# Patient Record
Sex: Female | Born: 1940 | Race: White | Hispanic: No | Marital: Single | Smoking: Former smoker
Health system: Southern US, Community
[De-identification: ages and names within clinical notes are randomized; demographics above are authoritative.]

## PROBLEM LIST (undated history)

## (undated) DIAGNOSIS — Z9889 Other specified postprocedural states: Secondary | ICD-10-CM

## (undated) DIAGNOSIS — R011 Cardiac murmur, unspecified: Secondary | ICD-10-CM

## (undated) DIAGNOSIS — M810 Age-related osteoporosis without current pathological fracture: Secondary | ICD-10-CM

## (undated) DIAGNOSIS — E039 Hypothyroidism, unspecified: Secondary | ICD-10-CM

## (undated) DIAGNOSIS — G629 Polyneuropathy, unspecified: Secondary | ICD-10-CM

## (undated) DIAGNOSIS — M1990 Unspecified osteoarthritis, unspecified site: Secondary | ICD-10-CM

## (undated) DIAGNOSIS — F419 Anxiety disorder, unspecified: Secondary | ICD-10-CM

## (undated) DIAGNOSIS — R112 Nausea with vomiting, unspecified: Secondary | ICD-10-CM

## (undated) DIAGNOSIS — K760 Fatty (change of) liver, not elsewhere classified: Secondary | ICD-10-CM

## (undated) DIAGNOSIS — Z8719 Personal history of other diseases of the digestive system: Secondary | ICD-10-CM

## (undated) DIAGNOSIS — K219 Gastro-esophageal reflux disease without esophagitis: Secondary | ICD-10-CM

## (undated) DIAGNOSIS — N189 Chronic kidney disease, unspecified: Secondary | ICD-10-CM

## (undated) DIAGNOSIS — I10 Essential (primary) hypertension: Secondary | ICD-10-CM

## (undated) HISTORY — PX: MIDDLE EAR SURGERY: SHX713

## (undated) HISTORY — PX: EYE SURGERY: SHX253

## (undated) HISTORY — PX: THYROIDECTOMY: SHX17

## (undated) HISTORY — PX: ABDOMINAL HYSTERECTOMY: SHX81

## (undated) HISTORY — PX: PARATHYROIDECTOMY: SHX19

---

## 2016-05-11 ENCOUNTER — Other Ambulatory Visit: Payer: Self-pay | Admitting: Orthopedic Surgery

## 2016-05-15 ENCOUNTER — Inpatient Hospital Stay (HOSPITAL_COMMUNITY)
Admission: RE | Admit: 2016-05-15 | Discharge: 2016-05-15 | Disposition: A | Discharge status: Home / Self Care | Payer: Self-pay | Source: Ambulatory Visit

## 2016-05-15 ENCOUNTER — Emergency Department (HOSPITAL_COMMUNITY): Payer: Medicare HMO

## 2016-05-15 ENCOUNTER — Encounter (HOSPITAL_COMMUNITY): Payer: Self-pay

## 2016-05-15 ENCOUNTER — Emergency Department (HOSPITAL_COMMUNITY)
Admission: EM | Admit: 2016-05-15 | Discharge: 2016-05-15 | Disposition: A | Discharge status: Home / Self Care | Payer: Medicare HMO | Attending: Emergency Medicine | Admitting: Emergency Medicine

## 2016-05-15 DIAGNOSIS — K529 Noninfective gastroenteritis and colitis, unspecified: Secondary | ICD-10-CM | POA: Insufficient documentation

## 2016-05-15 DIAGNOSIS — R1084 Generalized abdominal pain: Secondary | ICD-10-CM | POA: Diagnosis present

## 2016-05-15 DIAGNOSIS — Z79899 Other long term (current) drug therapy: Secondary | ICD-10-CM | POA: Diagnosis not present

## 2016-05-15 LAB — URINALYSIS, ROUTINE W REFLEX MICROSCOPIC
Bilirubin Urine: NEGATIVE
Glucose, UA: NEGATIVE mg/dL
HGB URINE DIPSTICK: NEGATIVE
Ketones, ur: NEGATIVE mg/dL
LEUKOCYTES UA: NEGATIVE
NITRITE: NEGATIVE
PROTEIN: NEGATIVE mg/dL
Specific Gravity, Urine: 1.013 (ref 1.005–1.030)
pH: 8 (ref 5.0–8.0)

## 2016-05-15 LAB — CBC
HCT: 31.5 % — ABNORMAL LOW (ref 36.0–46.0)
Hemoglobin: 10.4 g/dL — ABNORMAL LOW (ref 12.0–15.0)
MCH: 29.6 pg (ref 26.0–34.0)
MCHC: 33 g/dL (ref 30.0–36.0)
MCV: 89.7 fL (ref 78.0–100.0)
PLATELETS: 414 10*3/uL — AB (ref 150–400)
RBC: 3.51 MIL/uL — ABNORMAL LOW (ref 3.87–5.11)
RDW: 14.2 % (ref 11.5–15.5)
WBC: 8.7 10*3/uL (ref 4.0–10.5)

## 2016-05-15 LAB — COMPREHENSIVE METABOLIC PANEL
ALK PHOS: 110 U/L (ref 38–126)
ALT: 21 U/L (ref 14–54)
AST: 27 U/L (ref 15–41)
Albumin: 3.3 g/dL — ABNORMAL LOW (ref 3.5–5.0)
Anion gap: 10 (ref 5–15)
BILIRUBIN TOTAL: 0.6 mg/dL (ref 0.3–1.2)
BUN: 8 mg/dL (ref 6–20)
CHLORIDE: 99 mmol/L — AB (ref 101–111)
CO2: 27 mmol/L (ref 22–32)
Calcium: 9.6 mg/dL (ref 8.9–10.3)
Creatinine, Ser: 0.69 mg/dL (ref 0.44–1.00)
GFR calc Af Amer: >60 mL/min (ref >60)
Glucose, Bld: 112 mg/dL — ABNORMAL HIGH (ref 65–99)
Potassium: 4.1 mmol/L (ref 3.5–5.1)
Sodium: 136 mmol/L (ref 135–145)
Total Protein: 6.2 g/dL — ABNORMAL LOW (ref 6.5–8.1)

## 2016-05-15 LAB — I-STAT CG4 LACTIC ACID, ED: LACTIC ACID, VENOUS: 1.17 mmol/L (ref 0.5–1.9)

## 2016-05-15 LAB — TROPONIN I: Troponin I: <0.03 ng/mL (ref <0.03)

## 2016-05-15 LAB — LIPASE, BLOOD: LIPASE: 70 U/L — AB (ref 11–51)

## 2016-05-15 MED ORDER — ONDANSETRON HCL 4 MG/2ML IJ SOLN
4.0000 mg | Freq: Once | INTRAMUSCULAR | Status: AC
  Administered 2016-05-15: 4 mg via INTRAVENOUS
  Filled 2016-05-15: qty 2

## 2016-05-15 MED ORDER — FENTANYL CITRATE (PF) 100 MCG/2ML IJ SOLN
50.0000 ug | Freq: Once | INTRAMUSCULAR | Status: AC
  Administered 2016-05-15: 50 ug via INTRAVENOUS
  Filled 2016-05-15: qty 2

## 2016-05-15 MED ORDER — CIPROFLOXACIN HCL 500 MG PO TABS
500.0000 mg | ORAL_TABLET | Freq: Once | ORAL | Status: AC
  Administered 2016-05-15: 500 mg via ORAL
  Filled 2016-05-15: qty 1

## 2016-05-15 MED ORDER — CIPROFLOXACIN HCL 500 MG PO TABS: 500.0000 mg | ORAL_TABLET | Freq: Two times a day (BID) | ORAL | 0 refills | Status: AC

## 2016-05-15 MED ORDER — SODIUM CHLORIDE 0.9 % IV BOLUS (SEPSIS)
1000.0000 mL | Freq: Once | INTRAVENOUS | Status: AC
  Administered 2016-05-15: 1000 mL via INTRAVENOUS

## 2016-05-15 MED ORDER — IOPAMIDOL (ISOVUE-300) INJECTION 61%
INTRAVENOUS | Status: AC
  Administered 2016-05-15: 100 mL
  Filled 2016-05-15: qty 100

## 2016-05-15 MED ORDER — METRONIDAZOLE 500 MG PO TABS
500.0000 mg | ORAL_TABLET | Freq: Once | ORAL | Status: AC
Start: 2016-05-15 — End: 2016-05-15
  Administered 2016-05-15: 500 mg via ORAL
  Filled 2016-05-15: qty 1

## 2016-05-15 MED ORDER — FENTANYL CITRATE (PF) 100 MCG/2ML IJ SOLN
100.0000 ug | Freq: Once | INTRAMUSCULAR | Status: AC
  Administered 2016-05-15: 100 ug via INTRAVENOUS
  Filled 2016-05-15: qty 2

## 2016-05-15 MED ORDER — METRONIDAZOLE 500 MG PO TABS: 500.0000 mg | ORAL_TABLET | Freq: Two times a day (BID) | ORAL | 0 refills | Status: AC

## 2016-05-15 NOTE — Pre-Procedure Instructions (Signed)
Lavonia Danalice Costanza  05/15/2016      [id number] - [place], [place] - [address] AT [facility] [address] [facility] [place] [id number] Phone: [phone] Fax: [phone]    Your procedure is scheduled on 05-17-2016  Thursday .  [facility] at 11:45 A.M.\   Call this number if you have problems the morning of surgery:  [phone]   Remember:  Do not eat food or drink liquids after midnight.   Take these medicines the morning of surgery with A SIP OF WATER Alprazolam(Xanax) if needed,Atenolol(Tenormin),levothyroxine(Synthroid),Pain medication if needed,Kenalog cream if needed   STOP ASPIRIN,ANTIINFLAMATORIES (IBUPROFEN,ALEVE,MOTRIN,ADVIL,GOODY'S POWDERS),HERBAL SUPPLEMENTS,FISH OIL,AND VITAMINS 5-7 DAYS PRIOR TO SURGERY     Do not wear jewelry, make-up or nail polish.  Do not wear lotions, powders, or perfumes, or deoderant.  Do not shave 48 hours prior to surgery.  Men may shave face and neck.  Do not bring valuables to the hospital.  [facility] is not responsible for any belongings or valuables.  Contacts, dentures or bridgework may not be worn into surgery.  Leave your suitcase in the car.  After surgery it may be brought to your room.  For patients admitted to the hospital, discharge time will be determined by your treatment team.  Patients discharged the day of surgery will not be allowed to drive home.   Special Instructions: [place] - Preparing for Surgery  Before surgery, you can play an important role.  Because skin is not sterile, your skin needs to be as free of germs as possible.  You can reduce the number of germs on you skin by washing with CHG (chlorahexidine gluconate) soap before surgery.  CHG is an antiseptic cleaner which kills germs and bonds with the skin to continue killing germs even after washing.  Please DO NOT use if you have an allergy to CHG or antibacterial soaps.  If your  skin becomes reddened/irritated stop using the CHG and inform your nurse when you arrive at Short Stay.  Do not shave (including legs and underarms) for at least 48 hours prior to the first CHG shower.  You may shave your face.  Please follow these instructions carefully:   1.  Shower with CHG Soap the night before surgery and the   morning of Surgery.  2.  If you choose to wash your hair, wash your hair first as usual with your normal shampoo.  3.  After you shampoo, rinse your hair and body thoroughly to remove the  Shampoo.  4.  Use CHG as you would any other liquid soap.  You can apply chg directly  to the skin and wash gently with scrungie or a clean washcloth.  5.  Apply the CHG Soap to your body ONLY FROM THE NECK DOWN.   Do not use on open wounds or open sores.  Avoid contact with your eyes,  ears, mouth and genitals (private parts).  Wash genitals (private parts) with your normal soap.  6.  Wash thoroughly, paying special attention to the area where your surgery will be performed.  7.  Thoroughly rinse your body with warm water from the neck down.  8.  DO NOT shower/wash with your normal soap after using and rinsing o  the CHG Soap.  9.  Pat yourself dry with a clean towel.            10.  Wear clean pajamas.  11.  Place clean sheets on your bed the night of your first shower and do not sleep with pets.  Day of Surgery  Do not apply any lotions/deodorants the morning of surgery.  Please wear clean clothes to the hospital/surgery center.   Please read over the following fact sheets that you were given. Coughing and Deep Breathing and Surgical Site Infection Prevention, Incentive Spirometry

## 2016-05-15 NOTE — ED Notes (Signed)
Patient and family member up to Nurse first desk several times to inquire about wait. Apologized several times and explained process.

## 2016-05-15 NOTE — Progress Notes (Addendum)
Not here for 1200 PAT appointment. Called and left message to call us back. Pt is in the ED at this time.

## 2016-05-15 NOTE — ED Notes (Signed)
Patient transported to CT 

## 2016-05-15 NOTE — ED Notes (Signed)
Called Dr. Ave Filterhandler regarding patient's upcoming surgery

## 2016-05-15 NOTE — ED Triage Notes (Signed)
EMS- pt coming from home with abd pain. She was constipated this week and took milk of magnesia. She has had n/v/d with chills and sweats. Pt has also had non productive cough. She is scheduled to have surgery here in 2 days to repair a shoulder fracture.  No fever. 174/68, 82, 93% on RA.

## 2016-05-15 NOTE — ED Provider Notes (Signed)
18: 25-reevaluation post CT imaging, at the request of Dr. Rhunette CroftNanavati.  Patient has been ill for several days with constipation, for which she took milk of magnesia, now is having nausea vomiting diarrhea chills and sweats.  He is also been ill with a nonproductive cough.  She is due to have surgery, in 2 days for a shoulder fracture.  Ct Abdomen Pelvis W Contrast  Result Date: 05/15/2016 CLINICAL DATA:  Abdominal pain. Nausea, vomiting and diarrhea with chills sweats. Nonproductive cough. EXAM: CT ABDOMEN AND PELVIS WITH CONTRAST TECHNIQUE: Multidetector CT imaging of the abdomen and pelvis was performed using the standard protocol following bolus administration of intravenous contrast. CONTRAST:  100mL ISOVUE-300 IOPAMIDOL (ISOVUE-300) INJECTION 61% COMPARISON:  None. FINDINGS: Lower chest: The visualized cardiac chambers are normal in size. No pericardial effusion. Minimal pleural thickening along the periphery of the left hemithorax with adjacent atelectasis. There is a small hiatal hernia. Hepatobiliary: 10 mm hypodensity in the right hepatic lobe, nonspecific in appearance potentially a cyst or hemangioma. This can be further correlated with a nonemergent ultrasound. No biliary dilatation. Gallbladder is free of stones. Pancreas: Atrophic appearing pancreas without ductal dilatation or mass. Spleen: Normal Adrenals/Urinary Tract: Adrenal glands are unremarkable. Kidneys are normal, without renal calculi, focal lesion, or hydronephrosis. Bladder is unremarkable. Stomach/Bowel: Contracted stomach. Normal small bowel rotation without small-bowel dilatation nor obstruction. Mild t moderate diffuse thickening of the descending and sigmoid colon with scattered diverticulosis. Findings could represent sequela of a mild colitis. Appendix is not optimally visualized. Vascular/Lymphatic: Aortic atherosclerosis without aneurysm. No lymphadenopathy by size criteria. 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 Reproductive: Hysterectomy.  No adnexal mass. Other: No abdominal wall hernia or abnormality. No abdominopelvic ascites. Musculoskeletal: Degenerate disc disease L2-3 and L5-S1. Sclerotic density of the L5 vertebral body is nonspecific likely an isolated large bone island in the absence of medical history that may predispose to osteoblastic disease. IMPRESSION: 1. Mild-to-moderate diffuse transmural thickening of descending and sigmoid colon suspicious for colitis. Superimposed diverticulosis of the sigmoid colon. No bowel obstruction or perforation. 2. 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 3. 10 mm right hepatic hypodensity, nonspecific potentially complex cyst or hemangioma. This can be further correlated with ultrasound on a nonemergent basis. 4. Degenerative disc disease L2-3 and L5-S1 with sclerotic L5 vertebral body densities statistically more likely to represent a bone island. 5. Aortic atherosclerosis. Electronically Signed   By: Tollie Ethavid  Kwon M.D.   On: 05/15/2016 17:54    Medications  fentaNYL (SUBLIMAZE) injection 100 mcg (not administered)  ondansetron (ZOFRAN) injection 4 mg (4 mg Intravenous Given 05/15/16 1613)  sodium chloride 0.9 % bolus 1,000 mL (1,000 mLs Intravenous New Bag/Given 05/15/16 1613)  fentaNYL (SUBLIMAZE) injection 50 mcg (50 mcg Intravenous Given 05/15/16 1613)  iopamidol (ISOVUE-300) 61 % injection (100 mLs  Contrast Given 05/15/16 1727)    Patient Vitals for the past 24 hrs:  BP Temp Temp src Pulse Resp SpO2 Height Weight  05/15/16 1630 (!) 148/79 - - 97 20 100 % - -  05/15/16 1615 130/67 - - 84 - 98 % - -  05/15/16 1600 140/77 - - 80 - 93 % - -  05/15/16 1545 131/62 - - 82 - 96 % - -  05/15/16 1530 (!) 147/83 - - 84 - 100 % - -  05/15/16 1520 128/74 - - 84 - 98 % - -  05/15/16 1410 (!) 115/53 - - 83 (!) 22 95 % - -  05/15/16 1058 (!) 144/64 98.2 F (36.8 C) Oral 90 16 100 % 5' 4.5" (1.638 m) 155  lb (70.3 kg)    6:38 PM Reevaluation with update and discussion. After initial assessment and treatment, an updated evaluation reveals strong the patient is very uncomfortable, in pain, angry about the long duration of her stay, and wants to be put in a new bed.  Her sister and brother-in-law are here, and are directed, and cooperative, with efforts to help the patient.  Patient agrees to get a dose of narcotic analgesia, and be  rearranged in the bed.  Will check temperature.  She has upcoming surgery scheduled for 2 days from now, for repair of right humerus fracture. Sampson Self L   20: 45-at this time she is calm and comfortable and states her nausea has improved.  Currently she is sitting in a chair, and states that she feels much better.  She would like to go home.  Her family members are with her and concur and have no other concerns or questions.  Plan-discharge with prescriptions for Cipro and Flagyl, to treat colitis.  She states that she has plenty of narcotic medication, and antiemetic medication, at home.  She will contact her orthopedist regarding the new diagnosis, in light of the upcoming surgical repair of a fracture.    Mancel Bale, MD 05/15/16 2051

## 2016-05-15 NOTE — Discharge Instructions (Signed)
Plenty of rest, and drink a lot of fluids.  With a simple, bland, diet.  Call your surgeon in the morning to explain that you have been diagnosed with colitis.  Start taking the antibiotic medication, in the morning.

## 2016-05-24 NOTE — Progress Notes (Signed)
Called pt to provide pre-op instructions. Pt spouse stated that pt is currently "  in the hospital for dehydration, colitis, nausea and vomiting, I am not sure when she is coming home."  Spoke with Florentina Addison, Surgical Coordinator, to make MD aware.

## 2016-05-28 ENCOUNTER — Inpatient Hospital Stay: Admit: 2016-05-28 | Payer: Self-pay | Admitting: Orthopedic Surgery

## 2016-05-28 ENCOUNTER — Other Ambulatory Visit: Payer: Self-pay | Admitting: Orthopedic Surgery

## 2016-05-28 SURGERY — ARTHROPLASTY, SHOULDER, TOTAL, REVERSE
Anesthesia: General | Laterality: Right

## 2016-06-03 NOTE — ED Provider Notes (Signed)
[facility] Provider Note   CSN: [id number] Arrival date & time: 05/15/16  1052     History   Chief Complaint Chief Complaint  Patient presents with  . Abdominal Pain    HPI Lynn George is a 76 y.o. female.  HPI  Pt comes in with cc of abd pain. Pt has hx of HL, thyroid disorder. She doesn't have any hx of severe medical hx. Pt reports that for the past few days she has been having constipation - for which she has been taking milk of magnesia. Now she is having abd pain. Her pain is generalized and fairly constant. Pt has associated nausea, emesis. She denies nay back pain or hx of heavy smoking. Patient has no pain with urination, blood in the urine, or frequent urination. Pt also denies any vaginal discharge or bleeding.   History reviewed. No pertinent past medical history.  There are no active problems to display for this patient.   History reviewed. No pertinent surgical history.  OB History    No data available       Home Medications    Prior to Admission medications   Medication Sig Start Date End Date Taking? Authorizing Provider  alendronate (FOSAMAX) 70 MG tablet Take 70 mg by mouth every Monday. 03/20/16  Yes Historical Provider, MD  ALPRAZolam Prudy Feeler) 0.5 MG tablet Take 0.25-0.5 mg by mouth 2 (two) times daily as needed (for anxiety).   Yes Historical Provider, MD  amLODipine (NORVASC) 5 MG tablet Take 5 mg by mouth at bedtime. 05/13/16  Yes Historical Provider, MD  atenolol (TENORMIN) 50 MG tablet Take 50 mg by mouth 2 (two) times daily. 04/27/16  Yes Historical Provider, MD  atorvastatin (LIPITOR) 20 MG tablet Take 20 mg by mouth every evening.   Yes Historical Provider, MD  Calcium 500-125 MG-UNIT TABS Take 1 tablet by mouth daily.   Yes Historical Provider, MD  Cholecalciferol (VITAMIN D3) 5000 units TABS Take 5,000 Units by mouth daily.   Yes Historical Provider, MD  gabapentin (NEURONTIN) 300 MG capsule Take 300-600 mg by mouth at bedtime.    Yes Historical Provider, MD  hydrochlorothiazide (HYDRODIURIL) 25 MG tablet Take 25 mg by mouth daily.   Yes Historical Provider, MD  levothyroxine (SYNTHROID, LEVOTHROID) 88 MCG tablet Take 88 mcg by mouth daily before breakfast.   Yes Historical Provider, MD  nortriptyline (PAMELOR) 25 MG capsule Take 25-50 mg by mouth at bedtime.   Yes Historical Provider, MD  omeprazole (PRILOSEC) 20 MG capsule Take 20 mg by mouth daily.   Yes Historical Provider, MD  ondansetron (ZOFRAN) 4 MG tablet Take 4-8 mg by mouth every 8 (eight) hours as needed for nausea. 05/11/16  Yes Historical Provider, MD  oxyCODONE-acetaminophen (PERCOCET/ROXICET) 5-325 MG tablet Take 1 tablet by mouth every 4 (four) hours as needed for pain. 05/13/16  Yes Historical Provider, MD  sulfamethoxazole-trimethoprim (BACTRIM) 400-80 MG tablet Take 1 tablet by mouth every other day. For UTI prevention.   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   Yes Historical Provider, MD  aspirin EC 81 MG tablet Take 81 mg by mouth at bedtime.    Historical Provider, MD  ciprofloxacin (CIPRO) 500 MG tablet Take 1 tablet (500 mg total) by mouth 2 (two) times daily. One po bid x 7 days 05/15/16   Mancel Bale, MD  metroNIDAZOLE (FLAGYL) 500 MG tablet Take 1 tablet (  500 mg total) by mouth 2 (two) times daily. One po bid x 7 days 05/15/16   Mancel Bale, MD  Omega-3 Fatty Acids (FISH OIL PO) Take 1,000 mg by mouth daily.     Historical Provider, MD    Family History History reviewed. No pertinent family history.  Social History Social History  Substance Use Topics  . Smoking status: Never Smoker  . Smokeless tobacco: Never Used  . Alcohol use No     Allergies   Indocin [indomethacin]; Talwin [pentazocine]; Codeine; Morphine; Lisinopril; and Penicillins   Review of Systems Review of Systems  Constitutional: Positive for activity change.    Gastrointestinal: Positive for abdominal pain, nausea and vomiting.  All other systems reviewed and are negative.    Physical Exam Updated Vital Signs BP (!) 150/68   Pulse 98   Temp 97.6 F (36.4 C) (Oral)   Resp 17   Ht 5' 4.5" (1.638 m)   Wt 155 lb (70.3 kg)   SpO2 100%   BMI 26.19 kg/m   Physical Exam  Constitutional: She is oriented to person, place, and time. She appears well-developed and well-nourished.  HENT:  Head: Normocephalic and atraumatic.  Mucus membrane are moist  Eyes: EOM are normal. Pupils are equal, round, and reactive to light.  Neck: Neck supple.  Cardiovascular: Normal rate, regular rhythm and normal heart sounds.   No murmur heard. Pulmonary/Chest: Effort normal. No respiratory distress.  Abdominal: Soft. She exhibits no distension. There is tenderness. There is no rebound and no guarding.  Generalized tenderness, no peritoneal signs  Neurological: She is alert and oriented to person, place, and time.  Skin: Skin is warm and dry.  Nursing note and vitals reviewed.    ED Treatments / Results  Labs (all labs ordered are listed, but only abnormal results are displayed) Labs Reviewed  LIPASE, BLOOD - Abnormal; Notable for the following:       Result Value   Lipase 70 (*)    All other components within normal limits  COMPREHENSIVE METABOLIC PANEL - Abnormal; Notable for the following:    Chloride 99 (*)    Glucose, Bld 112 (*)    Total Protein 6.2 (*)    Albumin 3.3 (*)    All other components within normal limits  CBC - Abnormal; Notable for the following:    RBC 3.51 (*)    Hemoglobin 10.4 (*)    HCT 31.5 (*)    Platelets 414 (*)    All other components within normal limits  URINALYSIS, ROUTINE W REFLEX MICROSCOPIC - Abnormal; Notable for the following:    APPearance HAZY (*)    All other components within normal limits  TROPONIN I  I-STAT CG4 LACTIC ACID, ED    EKG  EKG Interpretation  Date/Time:  Tuesday May 15 2016  16:26:56 EDT Ventricular Rate:  90 PR Interval:    QRS Duration: 90 QT Interval:  413 QTC Calculation: 506 R Axis:   32 Text Interpretation:  Sinus rhythm Probable anterolateral infarct, old Prolonged QT interval diffuse q waves No acute changes Confirmed by Rhunette Croft, MD, Janey Genta [phone]) on 05/15/2016 4:54:18 PM       Radiology No results found.  Procedures Procedures (including critical care time)  Medications Ordered in ED Medications  ondansetron (ZOFRAN) injection 4 mg (4 mg Intravenous Given 05/15/16 1613)  sodium chloride 0.9 % bolus 1,000 mL (0 mLs Intravenous Stopped 05/15/16 1904)  fentaNYL (SUBLIMAZE) injection 50 mcg (50 mcg Intravenous Given 05/15/16 1613)  iopamidol (ISOVUE-300) 61 % injection (100 mLs  Contrast Given 05/15/16 1727)  fentaNYL (SUBLIMAZE) injection 100 mcg (100 mcg Intravenous Given 05/15/16 1900)  ondansetron (ZOFRAN) injection 4 mg (4 mg Intravenous Given 05/15/16 1929)  metroNIDAZOLE (FLAGYL) tablet 500 mg (500 mg Oral Given 05/15/16 1929)  ciprofloxacin (CIPRO) tablet 500 mg (500 mg Oral Given 05/15/16 1929)     Initial Impression / Assessment and Plan / ED Course  I have reviewed the triage vital signs and the nursing notes.  Pertinent labs & imaging results that were available during my care of the patient were reviewed by me and considered in my medical decision making (see chart for details).     Pt comes in with cc of abd pain.  Pt's abd pain is not focal and there is no peritoneal signs. Pt had a recent upper extremity fx and is on pain meds. It seems like she is having constipation, which could be opioid related. Pt is having worsening of the pain, and also has had some loose BM. DDX: colitis, mesenteric ischemia, diverticulitis, ileus, sbo, cystitis, abscess.  Plan is to get CT abd. Pt's care will be signed out to the incoming team.  Final Clinical Impressions(s) / ED Diagnoses   Final diagnoses:  Colitis    New Prescriptions Discharge  Medication List as of 05/15/2016  8:48 PM    START taking these medications   Details  ciprofloxacin (CIPRO) 500 MG tablet Take 1 tablet (500 mg total) by mouth 2 (two) times daily. One po bid x 7 days, Starting Tue 05/15/2016, Print    metroNIDAZOLE (FLAGYL) 500 MG tablet Take 1 tablet (500 mg total) by mouth 2 (two) times daily. One po bid x 7 days, Starting Tue 05/15/2016, Print         Derwood Kaplan, MD 06/03/16 [phone]

## 2016-06-06 ENCOUNTER — Encounter (HOSPITAL_COMMUNITY): Payer: Self-pay | Admitting: *Deleted

## 2016-06-06 NOTE — Progress Notes (Addendum)
Anesthesia Chart Review: SAME DAY WORK-UP.  Patient is a 76 year old female scheduled for right reverse shoulder arthroplasty on 06/07/16 by Dr. Ave Filter. She slipped and fell on black ice on 05/01/16 and sustained a proximal right humerus fracture. Surgery for repair was initially scheduled for 05/28/16, but was cancelled due to admission at [facility] (05/22/16-05/26/16) for sigmoid colitis and dehydration. She was given IV Cipro and Flagyl and symptoms gradually improved. She was discharged home on oral Cipro and Augmentin with plans to undergo shoulder surgery the following week. She was also re-evaluated on 06/05/16 by Douglass Rivers at [facility] for hospital follow-up, and she is also aware of surgery plans.  History includes former smoker, chronic kidney disease, hypertension, parathyroidectomy, thyroidectomy, hypothyroidism, anxiety, hiatal hernia, neuropathy, fatty liver, arthritis, GERD, osteoporosis, murmur (reported evaluation that was "okay" ~ 10 years ago), hysterectomy.   EKG 05/15/16: Sinus rhythm, probable anterior lateral infarct (age undetermined), prolonged QT interval (QT 413 ms, QTc 506 ms), diffuse (small) Q waves. (There is a prior tracing result narrative from 05/07/16 at [facility] that was done as part of a pre-operative evaluation by Juanetta Snow, NP that reads as NSR, possible lateral and inferior infarcts, age undetermined. Systolic murmur documented at that time with patient reporting it had previously been evaluated > 5 year ago. There was no symptoms of CHF, no edema, or need for supplement oxygen, so it appears nothing further was recommended then.) There are no prior tracings in Muse or Epic.    CXR 05/26/16: IMPRESSION: No plain radiographic evidence of acute cardiopulmonary disease.  US Liver 05/23/16 [facility]; Care Everywhere): Impression: Hepatic steatosis.  CT right shoulder 05/02/16 [facility]; Care Everywhere): IMPRESSION:  Comminuted  minimally displaced fracture of the proximal right humerus Fatty atrophy of the infraspinatus muscle suspicious for prior rotator cuff tear.  She needs labs on arrival.   She is a same day work-up, so further evaluation by her anesthesiologist on the day of surgery.   Velna Ochs [facility] Short Stay Center/Anesthesiology Phone [phone] 06/06/2016 3:07 PM

## 2016-06-06 NOTE — Progress Notes (Signed)
Patient verbalized understanding of instructions for surgery.  Patient stated that she had no further instructions  Instructed patient to Take all other medications as prescribed except 7 days prior to surgery STOP taking any Aspirin, Aleve, Naproxen, Ibuprofen, Motrin, Advil, Goody's, BC's, all herbal medications, fish oil, and all vitamins  Patient stated that she had an ECHO but its been about 10 years ago and patient could not remember when or where it exactly was at

## 2016-06-07 ENCOUNTER — Inpatient Hospital Stay (HOSPITAL_COMMUNITY): Payer: Worker's Compensation

## 2016-06-07 ENCOUNTER — Inpatient Hospital Stay (HOSPITAL_COMMUNITY): Payer: Worker's Compensation | Admitting: Vascular Surgery

## 2016-06-07 ENCOUNTER — Encounter (HOSPITAL_COMMUNITY): Payer: Self-pay | Admitting: *Deleted

## 2016-06-07 ENCOUNTER — Inpatient Hospital Stay (HOSPITAL_COMMUNITY)
Admission: RE | Admit: 2016-06-07 | Discharge: 2016-06-08 | DRG: 483 | Disposition: A | Discharge status: Home Health | Payer: Worker's Compensation | Source: Ambulatory Visit | Attending: Orthopedic Surgery | Admitting: Orthopedic Surgery

## 2016-06-07 ENCOUNTER — Encounter (HOSPITAL_COMMUNITY)
Admission: RE | Disposition: A | Discharge status: Home Health | Payer: Self-pay | Source: Ambulatory Visit | Attending: Orthopedic Surgery

## 2016-06-07 DIAGNOSIS — Z79899 Other long term (current) drug therapy: Secondary | ICD-10-CM

## 2016-06-07 DIAGNOSIS — G629 Polyneuropathy, unspecified: Secondary | ICD-10-CM | POA: Diagnosis present

## 2016-06-07 DIAGNOSIS — M25511 Pain in right shoulder: Secondary | ICD-10-CM | POA: Diagnosis present

## 2016-06-07 DIAGNOSIS — F419 Anxiety disorder, unspecified: Secondary | ICD-10-CM | POA: Diagnosis present

## 2016-06-07 DIAGNOSIS — K760 Fatty (change of) liver, not elsewhere classified: Secondary | ICD-10-CM | POA: Diagnosis present

## 2016-06-07 DIAGNOSIS — Z880 Allergy status to penicillin: Secondary | ICD-10-CM

## 2016-06-07 DIAGNOSIS — H269 Unspecified cataract: Secondary | ICD-10-CM | POA: Diagnosis present

## 2016-06-07 DIAGNOSIS — E876 Hypokalemia: Secondary | ICD-10-CM | POA: Diagnosis present

## 2016-06-07 DIAGNOSIS — I129 Hypertensive chronic kidney disease with stage 1 through stage 4 chronic kidney disease, or unspecified chronic kidney disease: Secondary | ICD-10-CM | POA: Diagnosis present

## 2016-06-07 DIAGNOSIS — E039 Hypothyroidism, unspecified: Secondary | ICD-10-CM | POA: Diagnosis present

## 2016-06-07 DIAGNOSIS — N189 Chronic kidney disease, unspecified: Secondary | ICD-10-CM | POA: Diagnosis present

## 2016-06-07 DIAGNOSIS — Z87891 Personal history of nicotine dependence: Secondary | ICD-10-CM

## 2016-06-07 DIAGNOSIS — Z96612 Presence of left artificial shoulder joint: Secondary | ICD-10-CM

## 2016-06-07 DIAGNOSIS — S42241A 4-part fracture of surgical neck of right humerus, initial encounter for closed fracture: Principal | ICD-10-CM | POA: Diagnosis present

## 2016-06-07 DIAGNOSIS — M810 Age-related osteoporosis without current pathological fracture: Secondary | ICD-10-CM | POA: Diagnosis present

## 2016-06-07 DIAGNOSIS — K219 Gastro-esophageal reflux disease without esophagitis: Secondary | ICD-10-CM | POA: Diagnosis present

## 2016-06-07 DIAGNOSIS — W000XXA Fall on same level due to ice and snow, initial encounter: Secondary | ICD-10-CM | POA: Diagnosis present

## 2016-06-07 DIAGNOSIS — Z888 Allergy status to other drugs, medicaments and biological substances status: Secondary | ICD-10-CM

## 2016-06-07 DIAGNOSIS — Z01818 Encounter for other preprocedural examination: Secondary | ICD-10-CM

## 2016-06-07 DIAGNOSIS — Z96611 Presence of right artificial shoulder joint: Secondary | ICD-10-CM

## 2016-06-07 DIAGNOSIS — Z7982 Long term (current) use of aspirin: Secondary | ICD-10-CM

## 2016-06-07 HISTORY — PX: REVERSE SHOULDER ARTHROPLASTY: SHX5054

## 2016-06-07 HISTORY — DX: Cardiac murmur, unspecified: R01.1

## 2016-06-07 HISTORY — DX: Hypothyroidism, unspecified: E03.9

## 2016-06-07 HISTORY — DX: Other specified postprocedural states: Z98.890

## 2016-06-07 HISTORY — DX: Anxiety disorder, unspecified: F41.9

## 2016-06-07 HISTORY — DX: Gastro-esophageal reflux disease without esophagitis: K21.9

## 2016-06-07 HISTORY — DX: Age-related osteoporosis without current pathological fracture: M81.0

## 2016-06-07 HISTORY — DX: Fatty (change of) liver, not elsewhere classified: K76.0

## 2016-06-07 HISTORY — DX: Essential (primary) hypertension: I10

## 2016-06-07 HISTORY — DX: Other specified postprocedural states: R11.2

## 2016-06-07 HISTORY — DX: Personal history of other diseases of the digestive system: Z87.19

## 2016-06-07 HISTORY — DX: Polyneuropathy, unspecified: G62.9

## 2016-06-07 HISTORY — DX: Chronic kidney disease, unspecified: N18.9

## 2016-06-07 HISTORY — DX: Unspecified osteoarthritis, unspecified site: M19.90

## 2016-06-07 LAB — CBC WITH DIFFERENTIAL/PLATELET
BASOS ABS: 0 10*3/uL (ref 0.0–0.1)
BASOS PCT: 0 %
Eosinophils Absolute: 0.2 10*3/uL (ref 0.0–0.7)
Eosinophils Relative: 3 %
HEMATOCRIT: 34.6 % — AB (ref 36.0–46.0)
Hemoglobin: 11.4 g/dL — ABNORMAL LOW (ref 12.0–15.0)
Lymphocytes Relative: 34 %
Lymphs Abs: 2.1 10*3/uL (ref 0.7–4.0)
MCH: 29.2 pg (ref 26.0–34.0)
MCHC: 32.9 g/dL (ref 30.0–36.0)
MCV: 88.7 fL (ref 78.0–100.0)
MONO ABS: 0.7 10*3/uL (ref 0.1–1.0)
MONOS PCT: 11 %
NEUTROS ABS: 3.3 10*3/uL (ref 1.7–7.7)
Neutrophils Relative %: 52 %
PLATELETS: 216 10*3/uL (ref 150–400)
RBC: 3.9 MIL/uL (ref 3.87–5.11)
RDW: 14.3 % (ref 11.5–15.5)
WBC: 6.3 10*3/uL (ref 4.0–10.5)

## 2016-06-07 LAB — COMPREHENSIVE METABOLIC PANEL
ALBUMIN: 3.7 g/dL (ref 3.5–5.0)
ALT: 17 U/L (ref 14–54)
ANION GAP: 9 (ref 5–15)
AST: 37 U/L (ref 15–41)
Alkaline Phosphatase: 84 U/L (ref 38–126)
BILIRUBIN TOTAL: 1.6 mg/dL — AB (ref 0.3–1.2)
BUN: 13 mg/dL (ref 6–20)
CO2: 30 mmol/L (ref 22–32)
Calcium: 9.4 mg/dL (ref 8.9–10.3)
Chloride: 96 mmol/L — ABNORMAL LOW (ref 101–111)
Creatinine, Ser: 0.83 mg/dL (ref 0.44–1.00)
GFR calc Af Amer: >60 mL/min (ref >60)
GFR calc non Af Amer: >60 mL/min (ref >60)
GLUCOSE: 82 mg/dL (ref 65–99)
POTASSIUM: 3.7 mmol/L (ref 3.5–5.1)
SODIUM: 135 mmol/L (ref 135–145)
TOTAL PROTEIN: 6.6 g/dL (ref 6.5–8.1)

## 2016-06-07 LAB — PROTIME-INR
INR: 1
Prothrombin Time: 13.2 seconds (ref 11.4–15.2)

## 2016-06-07 LAB — SURGICAL PCR SCREEN
MRSA, PCR: NEGATIVE
Staphylococcus aureus: NEGATIVE

## 2016-06-07 LAB — APTT: APTT: 23 s — AB (ref 24–36)

## 2016-06-07 SURGERY — ARTHROPLASTY, SHOULDER, TOTAL, REVERSE
Anesthesia: General | Site: Shoulder | Laterality: Right

## 2016-06-07 MED ORDER — EPHEDRINE SULFATE-NACL 50-0.9 MG/10ML-% IV SOSY
PREFILLED_SYRINGE | INTRAVENOUS | Status: DC | PRN
  Administered 2016-06-07 (×2): 10 mg via INTRAVENOUS
  Administered 2016-06-07: 25 mg via INTRAVENOUS

## 2016-06-07 MED ORDER — SODIUM CHLORIDE 0.9 % IV SOLN
INTRAVENOUS | Status: DC | PRN
  Administered 2016-06-07: 50 ug/min via INTRAVENOUS

## 2016-06-07 MED ORDER — LEVOTHYROXINE SODIUM 88 MCG PO TABS
88.0000 ug | ORAL_TABLET | Freq: Every day | ORAL | Status: DC
  Administered 2016-06-08: 88 ug via ORAL
  Filled 2016-06-07: qty 1

## 2016-06-07 MED ORDER — DIPHENHYDRAMINE HCL 50 MG/ML IJ SOLN
INTRAMUSCULAR | Status: DC | PRN
  Administered 2016-06-07: 10 mg via INTRAVENOUS

## 2016-06-07 MED ORDER — LACTATED RINGERS IV SOLN
INTRAVENOUS | Status: DC
  Administered 2016-06-07 (×2): via INTRAVENOUS

## 2016-06-07 MED ORDER — SUGAMMADEX SODIUM 200 MG/2ML IV SOLN
INTRAVENOUS | Status: DC | PRN
  Administered 2016-06-07: 200 mg via INTRAVENOUS

## 2016-06-07 MED ORDER — MEPERIDINE HCL 25 MG/ML IJ SOLN: 6.2500 mg | INTRAMUSCULAR | Status: DC | PRN

## 2016-06-07 MED ORDER — METOCLOPRAMIDE HCL 5 MG/ML IJ SOLN: 5.0000 mg | Freq: Three times a day (TID) | INTRAMUSCULAR | Status: DC | PRN

## 2016-06-07 MED ORDER — MORPHINE SULFATE (PF) 2 MG/ML IV SOLN: 1.0000 mg | INTRAVENOUS | Status: DC | PRN

## 2016-06-07 MED ORDER — ROCURONIUM BROMIDE 10 MG/ML (PF) SYRINGE
PREFILLED_SYRINGE | INTRAVENOUS | Status: DC | PRN
  Administered 2016-06-07: 20 mg via INTRAVENOUS
  Administered 2016-06-07: 50 mg via INTRAVENOUS

## 2016-06-07 MED ORDER — MIDAZOLAM HCL 2 MG/2ML IJ SOLN
INTRAMUSCULAR | Status: AC
  Filled 2016-06-07: qty 2

## 2016-06-07 MED ORDER — SODIUM CHLORIDE 0.9 % IR SOLN
Status: DC | PRN
Start: 2016-06-07 — End: 2016-06-07
  Administered 2016-06-07: 3000 mL

## 2016-06-07 MED ORDER — FENTANYL CITRATE (PF) 250 MCG/5ML IJ SOLN
INTRAMUSCULAR | Status: DC | PRN
  Administered 2016-06-07: 100 ug via INTRAVENOUS

## 2016-06-07 MED ORDER — MIDAZOLAM HCL 2 MG/2ML IJ SOLN
1.0000 mg | Freq: Once | INTRAMUSCULAR | Status: AC
  Administered 2016-06-07: 1 mg via INTRAVENOUS
  Filled 2016-06-07: qty 1

## 2016-06-07 MED ORDER — OXYCODONE-ACETAMINOPHEN 5-325 MG PO TABS: 1.0000 | ORAL_TABLET | ORAL | 0 refills | Status: AC | PRN

## 2016-06-07 MED ORDER — CLINDAMYCIN PHOSPHATE 600 MG/50ML IV SOLN
600.0000 mg | Freq: Four times a day (QID) | INTRAVENOUS | Status: AC
  Administered 2016-06-07 – 2016-06-08 (×3): 600 mg via INTRAVENOUS
  Filled 2016-06-07 (×3): qty 50

## 2016-06-07 MED ORDER — SODIUM CHLORIDE 0.9 % IJ SOLN
INTRAMUSCULAR | Status: DC | PRN
  Administered 2016-06-07: 40 mL

## 2016-06-07 MED ORDER — HYDROCHLOROTHIAZIDE 25 MG PO TABS
25.0000 mg | ORAL_TABLET | Freq: Every day | ORAL | Status: DC
  Filled 2016-06-07: qty 1

## 2016-06-07 MED ORDER — BUPIVACAINE LIPOSOME 1.3 % IJ SUSP
20.0000 mL | INTRAMUSCULAR | Status: DC
  Filled 2016-06-07: qty 20

## 2016-06-07 MED ORDER — SODIUM CHLORIDE 0.9 % IV SOLN
INTRAVENOUS | Status: DC
  Administered 2016-06-07: 18:00:00 via INTRAVENOUS

## 2016-06-07 MED ORDER — PHENOL 1.4 % MT LIQD
1.0000 | OROMUCOSAL | Status: DC | PRN
Start: 2016-06-07 — End: 2016-06-08

## 2016-06-07 MED ORDER — SCOPOLAMINE 1 MG/3DAYS TD PT72
MEDICATED_PATCH | TRANSDERMAL | Status: DC | PRN
  Administered 2016-06-07: 1 via TRANSDERMAL

## 2016-06-07 MED ORDER — ALUM & MAG HYDROXIDE-SIMETH 200-200-20 MG/5ML PO SUSP: 30.0000 mL | ORAL | Status: DC | PRN

## 2016-06-07 MED ORDER — VANCOMYCIN HCL IN DEXTROSE 1-5 GM/200ML-% IV SOLN
1000.0000 mg | INTRAVENOUS | Status: AC
  Administered 2016-06-07: 1000 mg via INTRAVENOUS
  Filled 2016-06-07: qty 200

## 2016-06-07 MED ORDER — ALPRAZOLAM 0.25 MG PO TABS
0.2500 mg | ORAL_TABLET | Freq: Two times a day (BID) | ORAL | Status: DC | PRN
  Administered 2016-06-07: 0.5 mg via ORAL
  Filled 2016-06-07: qty 2

## 2016-06-07 MED ORDER — DEXAMETHASONE SODIUM PHOSPHATE 10 MG/ML IJ SOLN
INTRAMUSCULAR | Status: DC | PRN
  Administered 2016-06-07: 10 mg via INTRAVENOUS

## 2016-06-07 MED ORDER — SCOPOLAMINE 1 MG/3DAYS TD PT72
MEDICATED_PATCH | TRANSDERMAL | Status: AC
  Filled 2016-06-07: qty 1

## 2016-06-07 MED ORDER — DOCUSATE SODIUM 100 MG PO CAPS
100.0000 mg | ORAL_CAPSULE | Freq: Two times a day (BID) | ORAL | Status: DC
  Administered 2016-06-07 – 2016-06-08 (×2): 100 mg via ORAL
  Filled 2016-06-07 (×2): qty 1

## 2016-06-07 MED ORDER — PROPOFOL 10 MG/ML IV BOLUS
INTRAVENOUS | Status: DC | PRN
  Administered 2016-06-07: 100 mg via INTRAVENOUS

## 2016-06-07 MED ORDER — 0.9 % SODIUM CHLORIDE (POUR BTL) OPTIME
TOPICAL | Status: DC | PRN
  Administered 2016-06-07: 1000 mL

## 2016-06-07 MED ORDER — LIDOCAINE 2% (20 MG/ML) 5 ML SYRINGE
INTRAMUSCULAR | Status: DC | PRN
  Administered 2016-06-07: 40 mg via INTRAVENOUS

## 2016-06-07 MED ORDER — METOCLOPRAMIDE HCL 5 MG PO TABS: 5.0000 mg | ORAL_TABLET | Freq: Three times a day (TID) | ORAL | Status: DC | PRN

## 2016-06-07 MED ORDER — ATENOLOL 50 MG PO TABS
50.0000 mg | ORAL_TABLET | Freq: Two times a day (BID) | ORAL | Status: DC
  Administered 2016-06-07 – 2016-06-08 (×2): 50 mg via ORAL
  Filled 2016-06-07 (×2): qty 1

## 2016-06-07 MED ORDER — METOCLOPRAMIDE HCL 5 MG/ML IJ SOLN: 10.0000 mg | Freq: Once | INTRAMUSCULAR | Status: DC | PRN

## 2016-06-07 MED ORDER — OXYCODONE HCL 5 MG PO TABS
5.0000 mg | ORAL_TABLET | ORAL | Status: DC | PRN
  Administered 2016-06-07 – 2016-06-08 (×3): 5 mg via ORAL
  Administered 2016-06-08: 10 mg via ORAL
  Administered 2016-06-08: 5 mg via ORAL
  Filled 2016-06-07 (×3): qty 1
  Filled 2016-06-07: qty 2
  Filled 2016-06-07: qty 1

## 2016-06-07 MED ORDER — TRANEXAMIC ACID 1000 MG/10ML IV SOLN
1000.0000 mg | INTRAVENOUS | Status: AC
  Administered 2016-06-07: 1000 mg via INTRAVENOUS
  Filled 2016-06-07: qty 10

## 2016-06-07 MED ORDER — MIDAZOLAM HCL 2 MG/2ML IJ SOLN
INTRAMUSCULAR | Status: AC
  Administered 2016-06-07: 1 mg via INTRAVENOUS
  Filled 2016-06-07: qty 2

## 2016-06-07 MED ORDER — FENTANYL CITRATE (PF) 100 MCG/2ML IJ SOLN: 25.0000 ug | INTRAMUSCULAR | Status: DC | PRN

## 2016-06-07 MED ORDER — AMLODIPINE BESYLATE 5 MG PO TABS
5.0000 mg | ORAL_TABLET | Freq: Every day | ORAL | Status: DC
  Administered 2016-06-07: 5 mg via ORAL
  Filled 2016-06-07: qty 1

## 2016-06-07 MED ORDER — DIPHENHYDRAMINE HCL 12.5 MG/5ML PO ELIX: 12.5000 mg | ORAL_SOLUTION | ORAL | Status: DC | PRN

## 2016-06-07 MED ORDER — FENTANYL CITRATE (PF) 100 MCG/2ML IJ SOLN
50.0000 ug | Freq: Once | INTRAMUSCULAR | Status: AC
  Administered 2016-06-07: 50 ug via INTRAVENOUS
  Filled 2016-06-07: qty 1

## 2016-06-07 MED ORDER — PROPOFOL 10 MG/ML IV BOLUS
INTRAVENOUS | Status: AC
  Filled 2016-06-07: qty 20

## 2016-06-07 MED ORDER — FENTANYL CITRATE (PF) 250 MCG/5ML IJ SOLN
INTRAMUSCULAR | Status: AC
  Filled 2016-06-07: qty 5

## 2016-06-07 MED ORDER — MENTHOL 3 MG MT LOZG: 1.0000 | LOZENGE | OROMUCOSAL | Status: DC | PRN

## 2016-06-07 MED ORDER — BUPIVACAINE-EPINEPHRINE (PF) 0.5% -1:200000 IJ SOLN
INTRAMUSCULAR | Status: DC | PRN
  Administered 2016-06-07: 30 mL via PERINEURAL

## 2016-06-07 MED ORDER — MIDAZOLAM HCL 2 MG/2ML IJ SOLN
INTRAMUSCULAR | Status: DC | PRN
  Administered 2016-06-07: 2 mg via INTRAVENOUS

## 2016-06-07 MED ORDER — FENTANYL CITRATE (PF) 100 MCG/2ML IJ SOLN
INTRAMUSCULAR | Status: AC
  Administered 2016-06-07: 50 ug via INTRAVENOUS
  Filled 2016-06-07: qty 2

## 2016-06-07 MED ORDER — BUPIVACAINE LIPOSOME 1.3 % IJ SUSP
INTRAMUSCULAR | Status: DC | PRN
  Administered 2016-06-07: 20 mL

## 2016-06-07 MED ORDER — ALENDRONATE SODIUM 70 MG PO TABS: 70.0000 mg | ORAL_TABLET | ORAL | Status: DC

## 2016-06-07 MED ORDER — ASPIRIN EC 325 MG PO TBEC
325.0000 mg | DELAYED_RELEASE_TABLET | Freq: Every day | ORAL | Status: DC
  Administered 2016-06-08: 325 mg via ORAL
  Filled 2016-06-07: qty 1

## 2016-06-07 MED ORDER — PANTOPRAZOLE SODIUM 40 MG PO TBEC
40.0000 mg | DELAYED_RELEASE_TABLET | Freq: Every day | ORAL | Status: DC
  Administered 2016-06-08: 40 mg via ORAL
  Filled 2016-06-07: qty 1

## 2016-06-07 MED ORDER — ZOLPIDEM TARTRATE 5 MG PO TABS: 5.0000 mg | ORAL_TABLET | Freq: Every evening | ORAL | Status: DC | PRN

## 2016-06-07 MED ORDER — FLEET ENEMA 7-19 GM/118ML RE ENEM: 1.0000 | ENEMA | Freq: Once | RECTAL | Status: DC | PRN

## 2016-06-07 MED ORDER — ATORVASTATIN CALCIUM 20 MG PO TABS
20.0000 mg | ORAL_TABLET | Freq: Every evening | ORAL | Status: DC
Start: 2016-06-07 — End: 2016-06-08
  Administered 2016-06-07: 20 mg via ORAL
  Filled 2016-06-07: qty 1

## 2016-06-07 MED ORDER — DOCUSATE SODIUM 100 MG PO CAPS: 100.0000 mg | ORAL_CAPSULE | Freq: Three times a day (TID) | ORAL | 0 refills | Status: AC | PRN

## 2016-06-07 MED ORDER — ONDANSETRON HCL 4 MG/2ML IJ SOLN
INTRAMUSCULAR | Status: DC | PRN
  Administered 2016-06-07 (×2): 4 mg via INTRAVENOUS

## 2016-06-07 MED ORDER — ONDANSETRON HCL 4 MG/2ML IJ SOLN
4.0000 mg | Freq: Four times a day (QID) | INTRAMUSCULAR | Status: DC | PRN
  Administered 2016-06-07: 4 mg via INTRAVENOUS
  Filled 2016-06-07: qty 2

## 2016-06-07 MED ORDER — ACETAMINOPHEN 500 MG PO TABS
1000.0000 mg | ORAL_TABLET | Freq: Four times a day (QID) | ORAL | Status: DC
  Administered 2016-06-07 – 2016-06-08 (×3): 1000 mg via ORAL
  Filled 2016-06-07 (×3): qty 2

## 2016-06-07 MED ORDER — POLYETHYLENE GLYCOL 3350 17 G PO PACK: 17.0000 g | PACK | Freq: Every day | ORAL | Status: DC | PRN

## 2016-06-07 MED ORDER — BISACODYL 5 MG PO TBEC: 5.0000 mg | DELAYED_RELEASE_TABLET | Freq: Every day | ORAL | Status: DC | PRN

## 2016-06-07 MED ORDER — ONDANSETRON HCL 4 MG PO TABS
4.0000 mg | ORAL_TABLET | Freq: Four times a day (QID) | ORAL | Status: DC | PRN
  Administered 2016-06-08: 4 mg via ORAL
  Filled 2016-06-07 (×2): qty 1

## 2016-06-07 MED ORDER — NORTRIPTYLINE HCL 25 MG PO CAPS
50.0000 mg | ORAL_CAPSULE | Freq: Every day | ORAL | Status: DC
  Filled 2016-06-07: qty 2

## 2016-06-07 MED ORDER — GABAPENTIN 300 MG PO CAPS
300.0000 mg | ORAL_CAPSULE | Freq: Every day | ORAL | Status: DC
  Filled 2016-06-07: qty 1

## 2016-06-07 MED ORDER — POVIDONE-IODINE 7.5 % EX SOLN
Freq: Once | CUTANEOUS | Status: DC
  Filled 2016-06-07: qty 118

## 2016-06-07 SURGICAL SUPPLY — 84 items
BASEPLATE P2 COATD GLND 6.5X30 (Shoulder) ×1 IMPLANT
BIT DRILL 5/64X5 DISP (BIT) ×3 IMPLANT
BLADE SAW SAG 73X25 THK (BLADE) ×2
BLADE SAW SGTL 73X25 THK (BLADE) ×1 IMPLANT
BLADE SURG 15 STRL LF DISP TIS (BLADE) IMPLANT
BLADE SURG 15 STRL SS (BLADE)
BOWL SMART MIX CTS (DISPOSABLE) ×3 IMPLANT
CEMENT BONE DEPUY (Cement) ×3 IMPLANT
CHLORAPREP W/TINT 26ML (MISCELLANEOUS) ×6 IMPLANT
CLOSURE STERI-STRIP 1/2X4 (GAUZE/BANDAGES/DRESSINGS) ×1
CLOSURE WOUND 1/2 X4 (GAUZE/BANDAGES/DRESSINGS) ×1
CLSR STERI-STRIP ANTIMIC 1/2X4 (GAUZE/BANDAGES/DRESSINGS) ×2 IMPLANT
COVER MAYO STAND STRL (DRAPES) IMPLANT
COVER SURGICAL LIGHT HANDLE (MISCELLANEOUS) ×3 IMPLANT
DRAPE INCISE IOBAN 66X45 STRL (DRAPES) ×3 IMPLANT
DRAPE ORTHO SPLIT 77X108 STRL (DRAPES) ×4
DRAPE SURG ORHT 6 SPLT 77X108 (DRAPES) ×2 IMPLANT
DRSG AQUACEL AG ADV 3.5X10 (GAUZE/BANDAGES/DRESSINGS) ×3 IMPLANT
ELECT BLADE 4.0 EZ CLEAN MEGAD (MISCELLANEOUS) ×3
ELECT REM PT RETURN 9FT ADLT (ELECTROSURGICAL) ×3
ELECTRODE BLDE 4.0 EZ CLN MEGD (MISCELLANEOUS) ×1 IMPLANT
ELECTRODE REM PT RTRN 9FT ADLT (ELECTROSURGICAL) ×1 IMPLANT
EVACUATOR 1/8 PVC DRAIN (DRAIN) IMPLANT
GLOVE BIO SURGEON STRL SZ7 (GLOVE) ×3 IMPLANT
GLOVE BIO SURGEON STRL SZ7.5 (GLOVE) ×3 IMPLANT
GLOVE BIOGEL PI IND STRL 7.0 (GLOVE) ×1 IMPLANT
GLOVE BIOGEL PI IND STRL 8 (GLOVE) ×1 IMPLANT
GLOVE BIOGEL PI INDICATOR 7.0 (GLOVE) ×2
GLOVE BIOGEL PI INDICATOR 8 (GLOVE) ×2
GOWN STRL REUS W/ TWL LRG LVL3 (GOWN DISPOSABLE) ×3 IMPLANT
GOWN STRL REUS W/ TWL XL LVL3 (GOWN DISPOSABLE) ×1 IMPLANT
GOWN STRL REUS W/TWL LRG LVL3 (GOWN DISPOSABLE) ×6
GOWN STRL REUS W/TWL XL LVL3 (GOWN DISPOSABLE) ×2
HANDPIECE INTERPULSE COAX TIP (DISPOSABLE) ×2
HOOD PEEL AWAY FLYTE STAYCOOL (MISCELLANEOUS) ×9 IMPLANT
INSERT EPOLY STND HUMERUS 32MM (Shoulder) ×3 IMPLANT
INSERT EPOLYSTD HUMERUS 32MM (Shoulder) ×1 IMPLANT
KIT BASIN OR (CUSTOM PROCEDURE TRAY) ×3 IMPLANT
KIT ROOM TURNOVER OR (KITS) ×3 IMPLANT
MANIFOLD NEPTUNE II (INSTRUMENTS) ×3 IMPLANT
NDL SUT 2 .5 CRC MAYO 1.732X (NEEDLE) ×1 IMPLANT
NEEDLE HYPO 25GX1X1/2 BEV (NEEDLE) IMPLANT
NEEDLE MAYO TAPER (NEEDLE) ×2
NEEDLE MAYO TROCAR (NEEDLE) IMPLANT
NOZZLE PRISM 8.5MM (MISCELLANEOUS) IMPLANT
NS IRRIG 1000ML POUR BTL (IV SOLUTION) ×3 IMPLANT
P2 COATDE GLNOID BSEPLT 6.5X30 (Shoulder) ×3 IMPLANT
PACK SHOULDER (CUSTOM PROCEDURE TRAY) ×3 IMPLANT
PAD ARMBOARD 7.5X6 YLW CONV (MISCELLANEOUS) ×6 IMPLANT
RESTRAINT HEAD UNIVERSAL NS (MISCELLANEOUS) ×3 IMPLANT
RETRIEVER SUT HEWSON (MISCELLANEOUS) ×3 IMPLANT
SCREW BONE LOCKING RSP 5.0X14 (Screw) ×6 IMPLANT
SCREW BONE LOCKING RSP 5.0X30 (Screw) ×3 IMPLANT
SCREW BONE RSP LOCK 5X14 (Screw) ×2 IMPLANT
SCREW BONE RSP LOCK 5X26 (Screw) ×1 IMPLANT
SCREW BONE RSP LOCK 5X30 (Screw) ×1 IMPLANT
SCREW BONE RSP LOCKING 5.0X26 (Screw) ×3 IMPLANT
SCREW RETAIN W/HEAD 4MM OFFSET (Shoulder) ×3 IMPLANT
SET HNDPC FAN SPRY TIP SCT (DISPOSABLE) ×1 IMPLANT
SLING ARM FOAM STRAP LRG (SOFTGOODS) ×3 IMPLANT
SLING ARM FOAM STRAP MED (SOFTGOODS) IMPLANT
SLING ARM IMMOBILIZER MED (SOFTGOODS) ×3 IMPLANT
SPONGE LAP 18X18 X RAY DECT (DISPOSABLE) ×6 IMPLANT
SPONGE LAP 4X18 X RAY DECT (DISPOSABLE) IMPLANT
STEM REV PRIMARY 8X108 (Shoulder) ×3 IMPLANT
STRIP CLOSURE SKIN 1/2X4 (GAUZE/BANDAGES/DRESSINGS) ×2 IMPLANT
SUCTION FRAZIER HANDLE 10FR (MISCELLANEOUS) ×2
SUCTION TUBE FRAZIER 10FR DISP (MISCELLANEOUS) ×1 IMPLANT
SUPPORT WRAP ARM LG (MISCELLANEOUS) ×3 IMPLANT
SUT ETHIBOND NAB CT1 #1 30IN (SUTURE) ×3 IMPLANT
SUT FIBERWIRE #2 38 T-5 BLUE (SUTURE) ×21
SUT MNCRL AB 4-0 PS2 18 (SUTURE) ×3 IMPLANT
SUT SILK 2 0 TIES 17X18 (SUTURE)
SUT SILK 2-0 18XBRD TIE BLK (SUTURE) IMPLANT
SUT VIC AB 2-0 CT1 27 (SUTURE) ×2
SUT VIC AB 2-0 CT1 TAPERPNT 27 (SUTURE) ×1 IMPLANT
SUTURE FIBERWR #2 38 T-5 BLUE (SUTURE) ×7 IMPLANT
SYR CONTROL 10ML LL (SYRINGE) IMPLANT
SYRINGE TOOMEY DISP (SYRINGE) IMPLANT
TAPE FIBER 2MM 7IN #2 BLUE (SUTURE) IMPLANT
TOWEL OR 17X24 6PK STRL BLUE (TOWEL DISPOSABLE) ×3 IMPLANT
TOWEL OR 17X26 10 PK STRL BLUE (TOWEL DISPOSABLE) ×3 IMPLANT
WATER STERILE IRR 1000ML POUR (IV SOLUTION) IMPLANT
YANKAUER SUCT BULB TIP NO VENT (SUCTIONS) IMPLANT

## 2016-06-07 NOTE — H&P (Signed)
Lynn George is an 76 y.o. female.   Chief Complaint: Right shoulder injury HPI: 37 female status post slip and fall on ice at work with right comminuted four-part proximal humerus fracture she was indicated for surgical treatment to try and give her the best possible outcome in terms of long-term pain relief and function.  Past Medical History:  Diagnosis Date  . Anxiety   . Arthritis   . Chronic kidney disease    "MD stated that kidneys are only functioning 3/4 of what it should be"  . Fatty liver   . GERD (gastroesophageal reflux disease)   . Heart murmur    wore a heart monitor for 2 days and MD thought everything was fine  had for many years  . History of hiatal hernia   . Hypertension   . Hypothyroidism   . Neuropathy    feet bilaterally  . Osteoporosis   . PONV (postoperative nausea and vomiting)     Past Surgical History:  Procedure Laterality Date  . ABDOMINAL HYSTERECTOMY    . EYE SURGERY     cataracts bilaterally  . MIDDLE EAR SURGERY     took out the middle bone and placed prostsis  . PARATHYROIDECTOMY    . THYROIDECTOMY      History reviewed. No pertinent family history. Social History:  reports that she has quit smoking. She has never used smokeless tobacco. She reports that she does not drink alcohol or use drugs.  Allergies:  Allergies  Allergen Reactions  . Indocin [Indomethacin] Swelling and Other (See Comments)    Numb feeling/swelling  . Penicillins Anaphylaxis, Swelling and Rash    Has patient had a PCN reaction causing immediate rash, facial/tongue/throat swelling, SOB or lightheadedness with hypotension: YES Has patient had a PCN reaction causing severe rash involving mucus membranes or skin necrosis:YES Has patient had a PCN reaction that required hospitalization NO Has patient had a PCN reaction occurring within the last 10 years: NO If all of the above answers are "NO", then may proceed with Cephalosporin use.    Loma Messing [Pentazocine] Other  (See Comments)    Hallucinations  . Codeine Nausea Only    Patient tolerates w/phenergan  . Morphine Nausea Only    Patient tolerates w/phenerGAN  . Lisinopril Cough    Medications Prior to Admission  Medication Sig Dispense Refill  . ALPRAZolam (XANAX) 0.5 MG tablet Take 0.25-0.5 mg by mouth 2 (two) times daily as needed (for anxiety). TAKES 1/2 TABLET IN THE MORNING AND 1 TABLET AT NIGHT    . amLODipine (NORVASC) 5 MG tablet Take 5 mg by mouth at bedtime.  1  . atenolol (TENORMIN) 50 MG tablet Take 50 mg by mouth 2 (two) times daily.  1  . atorvastatin (LIPITOR) 20 MG tablet Take 20 mg by mouth every evening.    . gabapentin (NEURONTIN) 300 MG capsule Take 300-600 mg by mouth at bedtime.    . hydrochlorothiazide (HYDRODIURIL) 25 MG tablet Take 25 mg by mouth daily.    Marland Kitchen levothyroxine (SYNTHROID, LEVOTHROID) 88 MCG tablet Take 88 mcg by mouth daily before breakfast.    . nortriptyline (PAMELOR) 25 MG capsule Take 50 mg by mouth at bedtime.     Marland Kitchen omeprazole (PRILOSEC) 20 MG capsule Take 20 mg by mouth daily.    Marland Kitchen oxyCODONE-acetaminophen (PERCOCET/ROXICET) 5-325 MG tablet Take 0.5-1.5 tablets by mouth 2 (two) times daily as needed for moderate pain. Pt takes 1/2 tablet, 1 tablet, and 1 1/2 tablets according  to pain level  0  . sulfamethoxazole-trimethoprim (BACTRIM) 400-80 MG tablet Take 1 tablet by mouth every other day. For UTI prevention.    Marland Kitchen alendronate (FOSAMAX) 70 MG tablet Take 70 mg by mouth every Monday.    Marland Kitchen aspirin EC 81 MG tablet Take 81 mg by mouth at bedtime.    . Calcium Carbonate-Vitamin D (CALCIUM PLUS VITAMIN D PO) Take 2 tablets by mouth daily.    . Cholecalciferol (VITAMIN D3) 5000 units TABS Take 5,000 Units by mouth daily.    . ciprofloxacin (CIPRO) 500 MG tablet Take 1 tablet (500 mg total) by mouth 2 (two) times daily. One po bid x 7 days (Patient not taking: Reported on 06/05/2016) 14 tablet 0  . metroNIDAZOLE (FLAGYL) 500 MG tablet Take 1 tablet (500 mg total) by  mouth 2 (two) times daily. One po bid x 7 days (Patient not taking: Reported on 06/05/2016) 14 tablet 0  . Omega-3 Fatty Acids (FISH OIL) 1000 MG CAPS Take 1,000 mg by mouth daily.    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       Results for orders placed or performed during the hospital encounter of 06/07/16 (from the past 48 hour(s))  CBC WITH DIFFERENTIAL     Status: Abnormal   Collection Time: 06/07/16  7:56 AM  Result Value Ref Range   WBC 6.3 4.0 - 10.5 K/uL   RBC 3.90 3.87 - 5.11 MIL/uL   Hemoglobin 11.4 (L) 12.0 - 15.0 g/dL   HCT 34.6 (L) 36.0 - 46.0 %   MCV 88.7 78.0 - 100.0 fL   MCH 29.2 26.0 - 34.0 pg   MCHC 32.9 30.0 - 36.0 g/dL   RDW 14.3 11.5 - 15.5 %   Platelets 216 150 - 400 K/uL   Neutrophils Relative % 52 %   Neutro Abs 3.3 1.7 - 7.7 K/uL   Lymphocytes Relative 34 %   Lymphs Abs 2.1 0.7 - 4.0 K/uL   Monocytes Relative 11 %   Monocytes Absolute 0.7 0.1 - 1.0 K/uL   Eosinophils Relative 3 %   Eosinophils Absolute 0.2 0.0 - 0.7 K/uL   Basophils Relative 0 %   Basophils Absolute 0.0 0.0 - 0.1 K/uL  Comprehensive metabolic panel     Status: Abnormal   Collection Time: 06/07/16  7:56 AM  Result Value Ref Range   Sodium 135 135 - 145 mmol/L   Potassium 3.7 3.5 - 5.1 mmol/L    Comment: SPECIMEN HEMOLYZED. HEMOLYSIS MAY AFFECT INTEGRITY OF RESULTS.   Chloride 96 (L) 101 - 111 mmol/L   CO2 30 22 - 32 mmol/L   Glucose, Bld 82 65 - 99 mg/dL   BUN 13 6 - 20 mg/dL   Creatinine, Ser 0.83 0.44 - 1.00 mg/dL   Calcium 9.4 8.9 - 10.3 mg/dL   Total Protein 6.6 6.5 - 8.1 g/dL   Albumin 3.7 3.5 - 5.0 g/dL   AST 37 15 - 41 U/L   ALT 17 14 - 54 U/L   Alkaline Phosphatase 84 38 - 126 U/L   Total Bilirubin 1.6 (H) 0.3 - 1.2 mg/dL   GFR calc non Af Amer >60 >60 mL/min   GFR calc Af Amer >60 >60 mL/min    Comment: (NOTE) The eGFR has been calculated using the CKD EPI equation. This calculation has not  been validated in all clinical situations. eGFR's persistently <60 mL/min signify possible Chronic Kidney  Disease.    Anion gap 9 5 - 15  Protime-INR     Status: None   Collection Time: 06/07/16  8:35 AM  Result Value Ref Range   Prothrombin Time 13.2 11.4 - 15.2 seconds   INR 1.00   APTT     Status: Abnormal   Collection Time: 06/07/16  8:35 AM  Result Value Ref Range   aPTT 23 (L) 24 - 36 seconds   Dg Chest 2 View  Result Date: 06/07/2016 CLINICAL DATA:  Preoperative examination prior performance of reverse right shoulder arthroplasty. EXAM: CHEST  2 VIEW COMPARISON:  None in PACs FINDINGS: There is an acute comminuted fracture of the right humeral head and neck. The lungs are well-expanded. There is no focal infiltrate. There is no pleural effusion. The heart is normal in size. There is calcification in the wall of the aortic arch. The pulmonary vascularity is normal. The observed bony thorax exhibits no acute abnormality. IMPRESSION: Mild chronic bronchitic -smoking related pulmonary changes. No acute cardiopulmonary abnormality. Thoracic aortic atherosclerosis. Electronically Signed   By: David  Martinique M.D.   On: 06/07/2016 07:52    Review of Systems  All other systems reviewed and are negative.   Blood pressure (!) 113/43, pulse 90, temperature 98.6 F (37 C), temperature source Oral, resp. rate 12, height 5' 4"  (1.626 m), weight 70.3 kg (155 lb), SpO2 96 %. Physical Exam  Constitutional: She is oriented to person, place, and time. She appears well-developed and well-nourished.  HENT:  Head: Atraumatic.  Eyes: EOM are normal.  Cardiovascular: Intact distal pulses.   Respiratory: Effort normal.  Musculoskeletal:  R shoulder pain with limited motion. NVID  Neurological: She is alert and oriented to person, place, and time.  Skin: Skin is warm and dry.  Psychiatric: She has a normal mood and affect.     Assessment/Plan Right displaced four-part proximal humerus  fracture Plan right reverse total shoulder arthroplasty Risks / benefits of surgery discussed Consent on chart  NPO for OR Preop antibiotics   Nita Sells, MD 06/07/2016, 10:33 AM

## 2016-06-07 NOTE — Discharge Instructions (Signed)

## 2016-06-07 NOTE — Anesthesia Postprocedure Evaluation (Signed)
Anesthesia Post Note  Patient: Lynn George  Procedure(s) Performed: Procedure(s) (LRB): REVERSE SHOULDER ARTHROPLASTY (Right)  Patient location during evaluation: PACU Anesthesia Type: General Level of consciousness: awake and alert and oriented Pain management: pain level controlled Vital Signs Assessment: post-procedure vital signs reviewed and stable Respiratory status: spontaneous breathing, nonlabored ventilation and respiratory function stable Cardiovascular status: blood pressure returned to baseline and stable Postop Assessment: no signs of nausea or vomiting Anesthetic complications: no       Last Vitals:  Vitals:   06/07/16 1420 06/07/16 1422  BP: (!) 80/47 (!) 86/47  Pulse: 66 67  Resp: 16 18  Temp:      Last Pain:  Vitals:   06/07/16 0758  TempSrc: Oral                 Ruberta Holck A.

## 2016-06-07 NOTE — Anesthesia Preprocedure Evaluation (Addendum)
Anesthesia Evaluation  Patient identified by MRN, date of birth, ID band Patient awake    Reviewed: Allergy & Precautions, NPO status , Patient's Chart, lab work & pertinent test results, reviewed documented beta blocker date and time   History of Anesthesia Complications (+) PONV and history of anesthetic complications  Airway Mallampati: III  TM Distance: >3 FB Neck ROM: Limited    Dental  (+) Teeth Intact, Caps   Pulmonary former smoker,    Pulmonary exam normal breath sounds clear to auscultation       Cardiovascular hypertension, Pt. on medications and Pt. on home beta blockers Normal cardiovascular exam+ Valvular Problems/Murmurs  Rhythm:Regular Rate:Normal     Neuro/Psych Anxiety negative neurological ROS     GI/Hepatic hiatal hernia, GERD  ,  Endo/Other  Hypothyroidism   Renal/GU Renal InsufficiencyRenal disease  negative genitourinary   Musculoskeletal  (+) Arthritis , Proximal right humerus Fx Osteoporosis   Abdominal   Peds  Hematology   Anesthesia Other Findings   Reproductive/Obstetrics                            Anesthesia Physical Anesthesia Plan  ASA: III  Anesthesia Plan: General and Regional   Post-op Pain Management:  Regional for Post-op pain   Induction: Intravenous  Airway Management Planned: Oral ETT  Additional Equipment:   Intra-op Plan:   Post-operative Plan: Extubation in OR  Informed Consent: I have reviewed the patients History and Physical, chart, labs and discussed the procedure including the risks, benefits and alternatives for the proposed anesthesia with the patient or authorized representative who has indicated his/her understanding and acceptance.   Dental advisory given  Plan Discussed with: CRNA, Anesthesiologist and Surgeon  Anesthesia Plan Comments:         Anesthesia Quick Evaluation

## 2016-06-07 NOTE — Transfer of Care (Signed)
Immediate Anesthesia Transfer of Care Note  Patient: Lynn George  Procedure(s) Performed: Procedure(s) with comments: REVERSE SHOULDER ARTHROPLASTY (Right) - Right reverse total shoulder arthroplasty  Patient Location: PACU  Anesthesia Type:GA combined with regional for post-op pain  Level of Consciousness: drowsy  Airway & Oxygen Therapy: Patient Spontanous Breathing and Patient connected to face mask oxygen  Post-op Assessment: Report given to RN and Post -op Vital signs reviewed and stable  Post vital signs: Reviewed and stable  Last Vitals:  Vitals:   06/07/16 0920 06/07/16 0923  BP: (!) 113/43 (!) 113/43  Pulse: 83 90  Resp: 14 12  Temp:      Last Pain:  Vitals:   06/07/16 0758  TempSrc: Oral      Patients Stated Pain Goal: 3 (06/07/16 0750)  Complications: No apparent anesthesia complications

## 2016-06-07 NOTE — Anesthesia Procedure Notes (Addendum)
Anesthesia Regional Block: Interscalene brachial plexus block   Pre-Anesthetic Checklist: ,, timeout performed, Correct Patient, Correct Site, Correct Laterality, Correct Procedure, Correct Position, site marked, Risks and benefits discussed,  Surgical consent,  Pre-op evaluation,  At surgeon's request and post-op pain management  Laterality: Right  Prep: chloraprep       Needles:  Injection technique: Single-shot  Needle Type: Echogenic Stimulator Needle     Needle Length: 9cm  Needle Gauge: 21     Additional Needles:   Procedures: ultrasound guided,,,,,,,,  Narrative:  Start time: 06/07/2016 9:18 AM End time: 06/07/2016 9:25 AM Injection made incrementally with aspirations every 5 mL.  Performed by: Personally  Anesthesiologist: Mal Amabile  Additional Notes: Timeout performed. Patient sedated. Relevant anatomy ID'd using [place]. Incremental 2-11ml injection with frequent aspiration. Patient tolerated procedure well.

## 2016-06-07 NOTE — Op Note (Signed)
Procedure(s): REVERSE SHOULDER ARTHROPLASTY Procedure Note  Lynn George female 76 y.o. 06/07/2016  Procedure(s) and Anesthesia Type:    * RIGHT REVERSE SHOULDER ARTHROPLASTY - General   Indications:  76 y.o. female  With right shoulder four-part proximal humerus fracture after a fall. Indicated for surgical treatment to try and the best outcome for potential function and pain control long-term     Surgeon: Mable Paris   Assistants: Damita Lack PA-C (Danielle was present and scrubbed throughout the procedure and was essential in positioning, retraction, exposure, and closure)  Anesthesia: General endotracheal anesthesia with preoperative interscalene block given by the attending anesthesiologist    Procedure Detail  REVERSE SHOULDER ARTHROPLASTY   Estimated Blood Loss:  300 mL         Drains: none  Blood Given: none          Specimens: none        Complications:  * No complications entered in OR log *         Disposition: PACU - hemodynamically stable.         Condition: stable      OPERATIVE FINDINGS:  A DJO Altivate pressfit reverse total shoulder arthroplasty was placed with a  size 8 stem, a 32-4 glenosphere, and a standard poly insert. The tuberosities were repaired around the implant.  The base plate  fixation was excellent.  PROCEDURE: The patient was identified in the preoperative holding area  where I personally marked the operative site after verifying site, side,  and procedure with the patient. An interscalene block given by  the attending anesthesiologist in the holding area and the patient was taken back to the operating room where all extremities were  carefully padded in position after general anesthesia was induced. She  was placed in a beach-chair position and the operative upper extremity was  prepped and draped in a standard sterile fashion. The patient received 1 g IV tranexamic acid at the start of the case around time of  the incision. An approximately 10-  cm incision was made from the tip of the coracoid process to the center  point of the humerus at the level of the axilla. Dissection was carried  down through subcutaneous tissues to the level of the cephalic vein  which was taken laterally with the deltoid. The pectoralis major was  retracted medially. The subdeltoid space was developed and the lateral  edge of the conjoined tendon was identified. The undersurface of  conjoined tendon was palpated and the musculocutaneous nerve was not in  the field. Retractor was placed underneath the conjoined and second  retractor was placed lateral into the deltoid.. The  biceps tendon was tenotomized.  Fracture planes were carefully developed using a Cobb elevator. A fair amount of healing bone had already laid down. I was able to separate the fragments of the lesser tuberosity greater tuberosity and shaft. The head fragment was removed. Sutures were placed in the tuberosities for later repair.    The glenoid was exposed with the arm in an  abducted extended position. The anterior and posterior labrum were  completely excised and the capsule was released circumferentially to  allow for exposure of the glenoid for preparation. The 2.5 mm drill was  placed using the guide in 5-10 inferior angulation and the tap was then advanced in the same hole. Small and large reamers were then used. The tap was then removed and the Metaglene was then screwed in with excellent purchase.  The peripheral guide  was then used to drilled measured and filled peripheral locking screws. The size  32-4  glenosphere was then impacted on the [facility] taper and the central screw was placed. The humerus was then again exposed and the diaphyseal reamers were used to sounded the canal. The size 8 stem was felt to be the appropriate size. It was placed at the estimated height and reduced Therefore, final humeral stem was placed cemented with proximal bone  grafting.  The standard poly-component was felt to be appropriate. The final poly-was place. The joint was reduced and the tuberosities were repaired around the implant with total of 7 #2 FiberWire sutures.  The joint was then copiously irrigated with pulse  lavage and the wound was then closed. Throughout the case a mixture of 20 mL liposomal bupivacaine and 60 mL normal saline was used to infiltrate the deep capsular tissue, bony surfaces and subcutaneous tissue. Skin was closed with 2-0 Vicryl in a deep dermal layer and 4-0  Monocryl for skin closure. Steri-Strips were applied. Sterile  dressings were then applied as well as a sling. The patient was allowed  to awaken from general anesthesia, transferred to stretcher, and taken  to recovery room in stable condition.   POSTOPERATIVE PLAN: The patient will be kept in the hospital postoperatively  for pain control and therapy.

## 2016-06-07 NOTE — Anesthesia Procedure Notes (Signed)
Procedure Name: Intubation Date/Time: 06/07/2016 11:08 AM Performed by: Izola Price Pre-anesthesia Checklist: Patient identified, Emergency Drugs available, Suction available and Patient being monitored Patient Re-evaluated:Patient Re-evaluated prior to inductionOxygen Delivery Method: Circle system utilized Preoxygenation: Pre-oxygenation with 100% oxygen Intubation Type: IV induction Ventilation: Mask ventilation without difficulty Grade View: Grade III Tube type: Oral Tube size: 7.0 mm Number of attempts: 1 Airway Equipment and Method: Stylet and Oral airway Placement Confirmation: ETT inserted through vocal cords under direct vision,  positive ETCO2 and breath sounds checked- equal and bilateral Secured at: 21 cm Tube secured with: Tape Dental Injury: Teeth and Oropharynx as per pre-operative assessment

## 2016-06-07 NOTE — Anesthesia Procedure Notes (Signed)
Performed by: COCKFIELD JR, Brave Dack WALTON       

## 2016-06-08 ENCOUNTER — Encounter (HOSPITAL_COMMUNITY): Payer: Self-pay | Admitting: Orthopedic Surgery

## 2016-06-08 LAB — CBC
HEMATOCRIT: 29.7 % — AB (ref 36.0–46.0)
HEMOGLOBIN: 9.8 g/dL — AB (ref 12.0–15.0)
MCH: 29.2 pg (ref 26.0–34.0)
MCHC: 33 g/dL (ref 30.0–36.0)
MCV: 88.4 fL (ref 78.0–100.0)
Platelets: 195 10*3/uL (ref 150–400)
RBC: 3.36 MIL/uL — AB (ref 3.87–5.11)
RDW: 14 % (ref 11.5–15.5)
WBC: 16 10*3/uL — ABNORMAL HIGH (ref 4.0–10.5)

## 2016-06-08 LAB — BASIC METABOLIC PANEL
Anion gap: 11 (ref 5–15)
BUN: 12 mg/dL (ref 6–20)
CHLORIDE: 97 mmol/L — AB (ref 101–111)
CO2: 25 mmol/L (ref 22–32)
Calcium: 8.6 mg/dL — ABNORMAL LOW (ref 8.9–10.3)
Creatinine, Ser: 0.83 mg/dL (ref 0.44–1.00)
GFR calc non Af Amer: >60 mL/min (ref >60)
Glucose, Bld: 142 mg/dL — ABNORMAL HIGH (ref 65–99)
POTASSIUM: 3.1 mmol/L — AB (ref 3.5–5.1)
SODIUM: 133 mmol/L — AB (ref 135–145)

## 2016-06-08 MED ORDER — POTASSIUM CHLORIDE 20 MEQ PO PACK
40.0000 meq | PACK | Freq: Once | ORAL | Status: AC
  Administered 2016-06-08: 40 meq via ORAL
  Filled 2016-06-08: qty 2

## 2016-06-08 NOTE — Progress Notes (Signed)
   PATIENT ID: Lynn George   1 Day Post-Op Procedure(s) (LRB): REVERSE SHOULDER ARTHROPLASTY (Right)  Subjective: doing well, pain level low. Well controlled.   Objective:  Vitals:   06/08/16 0109 06/08/16 0600  BP: (!) 116/56 129/63  Pulse: 88 90  Resp: 16 16  Temp: 98.3 F (36.8 C) 98 F (36.7 C)     R shoulder dressing c/d/I, NVID, sling intact  Labs:   Recent Labs  06/07/16 0756 06/08/16 0620  HGB 11.4* 9.8*   Recent Labs  06/07/16 0756 06/08/16 0620  WBC 6.3 16.0*  RBC 3.90 3.36*  HCT 34.6* 29.7*  PLT 216 195   Recent Labs  06/07/16 0756 06/08/16 0620  NA 135 133*  K 3.7 3.1*  CL 96* 97*  CO2 30 25  BUN 13 12  CREATININE 0.83 0.83  GLUCOSE 82 142*  CALCIUM 9.4 8.6*    Assessment and Plan:Doing well POD1, R reverse TSA for fx Plan home today, f/u 2 wks. Hypokalemia: replete with oral  VTE proph: ECASA

## 2016-06-08 NOTE — Care Management Note (Signed)
Case Management Note  Patient Details  Name: Lynn George MRN: [id number] Date of Birth: February 11, 1941  Subjective/Objective:    76 yr old female s/p right reverse total shoulder arthroplasty.                Action/Plan: Case manager faxed orders and discharge summary to [facility], RNCM with [place] [phone], [phone]. Melissa states that [facility] will arrange [facility]. Patient will discharge home with husband.    Expected Discharge Date:  06/08/16               Expected Discharge Plan:  Home w Home Health Services  In-House Referral:     Discharge planning Services  CM Consult  Post Acute Care Choice:  Home Health Choice offered to:  NA (patient under worker's comp)  DME Arranged:  N/A DME Agency:  NA  HH Arranged:  PT, OT, Nurse's Aide HH Agency:  Other - See comment (worker's comp- [facility] will arrange HH)  Status of Service:  In process, will continue to follow  If discussed at Long Length of Stay Meetings, dates discussed:    Additional Comments:  Durenda Guthrie, RN 06/08/2016, 12:55 PM

## 2016-06-08 NOTE — Progress Notes (Signed)
Occupational Therapy Evaluation Patient Details Name: Lynn George MRN: [id number] DOB: 1940/07/25 Today's Date: 06/08/2016    History of Present Illness s/p R reverse TSA. Significant PMH for arthiritis, anxietty, CKD, GERD, HTN, neuropathy, osteoporosis   Clinical Impression   PTA, pt living at home with elderly husband and worked part time. Pt seen x 2 for evaluation and treatment to facilitate safe DC home. All education regarding compensatory techniques for ADL, functional mobility and HEP per conservative protocol were completed. Pt/husband given written information and verbalized understanding. Due to history to of peripheral neuropathy, history of falls and prior level of function, recommend follow up with home health services.     Follow Up Recommendations  Home health OT;Supervision/Assistance - 24 hour (home health Aide, HHPT)    Equipment Recommendations  None recommended by OT    Recommendations for Other Services       Precautions / Restrictions Precautions Precautions: Shoulder Type of Shoulder Precautions: conservative; No shoulder ROM, elbow/wrist/hand ROM only Shoulder Interventions: Shoulder sling/immobilizer;At all times;Off for dressing/bathing/exercises Precaution Booklet Issued: Yes (comment) Required Braces or Orthoses: Sling Restrictions Weight Bearing Restrictions: Yes LUE Weight Bearing: Non weight bearing      Mobility Bed Mobility Overal bed mobility: Needs Assistance Bed Mobility: Supine to Sit     Supine to sit: Min assist        Transfers Overall transfer level: Needs assistance   Transfers: Sit to/from Stand Sit to Stand: Min assist         General transfer comment: Pt unsteady. High risk for falls.    Balance Overall balance assessment: History of Falls;Needs assistance   Sitting balance-Leahy Scale: Good     Standing balance support: During functional activity Standing balance-Leahy Scale: Poor                              ADL either performed or assessed with clinical judgement   ADL Overall ADL's : Needs assistance/impaired                                     Functional mobility during ADLs: Minimal assistance General ADL Comments: Pt requries mod A for all ADL. Husband has not been putting R UE into clothing and only draping shirt over shoulder. Educated pt/husband on haw to assist pt with bathing and dressing appropriately according to precautions. Pt/husband verbalized understanding. Pt high fall risk. States she has been "out of commision" since her fall and is weaker than normal and has a harder time getting around due to her neuropathy. Pt is high fall risk. Pt states her husband has been helping her but it is "taking a toll on him".      Vision         Perception     Praxis      Pertinent Vitals/Pain Pain Assessment: 0-10 Pain Score: 5  Pain Location: R shoulder Pain Descriptors / Indicators: Aching;Discomfort;Sore Pain Intervention(s): Limited activity within patient's tolerance;Repositioned;Ice applied     Hand Dominance Right   Extremity/Trunk Assessment Upper Extremity Assessment Upper Extremity Assessment: RUE deficits/detail RUE Deficits / Details: R elbow/wrist/hand WFL. RUE Coordination: decreased gross motor   Lower Extremity Assessment Lower Extremity Assessment:  (hx of peripheral neuropathy)   Cervical / Trunk Assessment Cervical / Trunk Assessment: Normal   Communication Communication Communication: No difficulties   Cognition Arousal/Alertness: Awake/alert Behavior During Therapy:  WFL for tasks assessed/performed Overall Cognitive Status: Within Functional Limits for tasks assessed                                     General Comments       Exercises Exercises: Shoulder Shoulder Exercises Elbow Flexion: AROM;AAROM;Right;10 reps;Seated Elbow Extension: AROM;AAROM;Right;10 reps;Seated Wrist Flexion: AROM;Right;10  reps;Seated Wrist Extension: AROM;Right;10 reps;Seated Digit Composite Flexion: AROM;Right;10 reps Composite Extension: AROM;Right;10 reps Donning/doffing shirt without moving shoulder: Minimal assistance Method for sponge bathing under operated UE: Minimal assistance Donning/doffing sling/immobilizer: Supervision/safety Correct positioning of sling/immobilizer: Supervision/safety ROM for elbow, wrist and digits of operated UE: Supervision/safety Sling wearing schedule (on at all times/off for ADL's): Supervision/safety Proper positioning of operated UE when showering: Supervision/safety Positioning of UE while sleeping: Supervision/safety   Shoulder Instructions Shoulder Instructions Donning/doffing shirt without moving shoulder: Minimal assistance Method for sponge bathing under operated UE: Minimal assistance Donning/doffing sling/immobilizer: Supervision/safety Correct positioning of sling/immobilizer: Supervision/safety ROM for elbow, wrist and digits of operated UE: Supervision/safety Sling wearing schedule (on at all times/off for ADL's): Supervision/safety Proper positioning of operated UE when showering: Supervision/safety Positioning of UE while sleeping: Supervision/safety    Home Living Family/patient expects to be discharged to:: Private residence Living Arrangements: Spouse/significant other Available Help at Discharge: Family;Available 24 hours/day Type of Home: House Home Access: Stairs to enter [employer] of Steps: 1   Home Layout: Two level;Able to live on main level with bedroom/bathroom     Bathroom Shower/Tub: Producer, television/film/video: Standard Bathroom Accessibility: Yes How Accessible: Accessible via walker Home Equipment: Walker - 2 wheels;Cane - single point;Grab bars - toilet          Prior Functioning/Environment Level of Independence: Independent        Comments: worked as Geologist, engineering at [employer]  Problem List: Decreased strength;Decreased range of motion;Impaired balance (sitting and/or standing);Decreased coordination;Decreased safety awareness;Decreased knowledge of precautions;Pain;Impaired UE functional use;Increased edema      OT Treatment/Interventions:      OT Goals(Current goals can be found in the care plan section) Acute Rehab OT Goals Patient Stated Goal: to go home OT Goal Formulation: All assessment and education complete, DC therapy  OT Frequency:     Barriers to D/C:            Co-evaluation              End of Session Equipment Utilized During Treatment: Gait belt Nurse Communication: Other (comment) (DC needs)  Activity Tolerance: Patient tolerated treatment well Patient left: in chair;with call bell/phone within reach;with family/visitor present  OT Visit Diagnosis: Unsteadiness on feet (R26.81);Muscle weakness (generalized) (M62.81);Pain Pain - Right/Left: Right Pain - part of body: Shoulder                Time: 3086-5784 OT Time Calculation (min): 14 min   2nd visit : Time: 1040-1105 OT Time calculation (min): 25 min  Charges:  OT General Charges $OT Visit:  (2 visits) OT Evaluation $OT Eval Moderate Complexity: 1 Procedure OT Treatments $Self Care/Home Management : 8-22 mins $Therapeutic Exercise: 8-22 mins G-Codes:     [facility], OT/L  [phone] 06/08/2016  Tawan Degroote,HILLARY 06/08/2016, 1:14 PM

## 2016-06-08 NOTE — Discharge Summary (Signed)
Patient ID: Lynn George MRN: [id number] DOB/AGE: 78-May-1942 78 y.o.  Admit date: 06/07/2016 Discharge date: 06/08/2016  Admission Diagnoses:  Active Problems:   S/P reverse total shoulder arthroplasty, left   Discharge Diagnoses:  Same  Past Medical History:  Diagnosis Date  . Anxiety   . Arthritis   . Chronic kidney disease    "MD stated that kidneys are only functioning 3/4 of what it should be"  . Fatty liver   . GERD (gastroesophageal reflux disease)   . Heart murmur    wore a heart monitor for 2 days and MD thought everything was fine  had for many years  . History of hiatal hernia   . Hypertension   . Hypothyroidism   . Neuropathy    feet bilaterally  . Osteoporosis   . PONV (postoperative nausea and vomiting)     Surgeries: Procedure(s): REVERSE SHOULDER ARTHROPLASTY on 06/07/2016   Consultants:   Discharged Condition: Improved  Hospital Course: Lynn George is an 76 y.o. female who was admitted 06/07/2016 for operative treatment of Right proximal humerus fracture. After pre-op clearance the patient was taken to the operating room on 06/07/2016 and underwent  Procedure(s): REVERSE SHOULDER ARTHROPLASTY.    Patient was given perioperative antibiotics: Anti-infectives    Start     Dose/Rate Route Frequency Ordered Stop   06/07/16 1700  clindamycin (CLEOCIN) IVPB 600 mg     600 mg 100 mL/hr over 30 Minutes Intravenous Every 6 hours 06/07/16 1548 06/08/16 0628   06/07/16 0900  vancomycin (VANCOCIN) IVPB 1000 mg/200 mL premix     1,000 mg 200 mL/hr over 60 Minutes Intravenous On call to O.R. 06/07/16 4098 06/07/16 1117       Patient was given sequential compression devices, early ambulation, and chemoprophylaxis to prevent DVT.  Patient benefited maximally from hospital stay and there were no complications.    Recent vital signs: Patient Vitals for the past 24 hrs:  BP Temp Temp src Pulse Resp SpO2  06/08/16 0600 129/63 98 F (36.7 C) Oral 90 16 95 %   06/08/16 0109 (!) 116/56 98.3 F (36.8 C) Oral 88 16 95 %  06/07/16 2144 123/70 98.4 F (36.9 C) Oral 89 15 94 %  06/07/16 1604 (!) 123/59 97.5 F (36.4 C) Oral 78 14 90 %  06/07/16 1516 - 97 F (36.1 C) - 70 12 90 %  06/07/16 1500 - - - 68 14 99 %  06/07/16 1450 (!) 101/53 - - 68 15 97 %  06/07/16 1435 (!) 89/47 - - 67 14 94 %  06/07/16 1430 - - - 66 16 96 %  06/07/16 1429 (!) 89/43 - - 65 16 93 %  06/07/16 1422 (!) 86/47 - - 67 18 94 %  06/07/16 1420 (!) 80/47 - - 66 16 92 %  06/07/16 1350 (!) 104/46 - - 66 14 100 %  06/07/16 1341 (!) 112/53 (!) 96.8 F (36 C) - 66 14 100 %  06/07/16 0923 (!) 113/43 - - 90 12 96 %  06/07/16 0920 (!) 113/43 - - 83 14 99 %  06/07/16 0915 (!) 117/55 - - 77 11 96 %  06/07/16 0910 (!) 130/46 - - 72 10 99 %  06/07/16 0905 (!) 147/54 - - 70 13 100 %  06/07/16 0900 139/60 - - 69 14 100 %  06/07/16 0855 (!) 130/53 - - 73 17 97 %     Recent laboratory studies:  Recent Labs  06/07/16 0756 06/07/16 1191  06/08/16 0620  WBC 6.3  --  16.0*  HGB 11.4*  --  9.8*  HCT 34.6*  --  29.7*  PLT 216  --  195  NA 135  --  133*  K 3.7  --  3.1*  CL 96*  --  97*  CO2 30  --  25  BUN 13  --  12  CREATININE 0.83  --  0.83  GLUCOSE 82  --  142*  INR  --  1.00  --   CALCIUM 9.4  --  8.6*     Discharge Medications:   Allergies as of 06/08/2016      Reactions   Indocin [indomethacin] Swelling, Other (See Comments)   Numb feeling/swelling   Penicillins Anaphylaxis, Swelling, Rash   Has patient had a PCN reaction causing immediate rash, facial/tongue/throat swelling, SOB or lightheadedness with hypotension: YES Has patient had a PCN reaction causing severe rash involving mucus membranes or skin necrosis:YES Has patient had a PCN reaction that required hospitalization NO Has patient had a PCN reaction occurring within the last 10 years: NO If all of the above answers are "NO", then may proceed with Cephalosporin use.   Talwin [pentazocine] Other (See  Comments)   Hallucinations   Codeine Nausea Only   Patient tolerates w/phenergan   Morphine Nausea Only   Patient tolerates w/phenerGAN   Lisinopril Cough      Medication List    TAKE these medications   alendronate 70 MG tablet Commonly known as:  FOSAMAX Take 70 mg by mouth every Monday.   ALPRAZolam 0.5 MG tablet Commonly known as:  XANAX Take 0.25-0.5 mg by mouth 2 (two) times daily as needed (for anxiety). TAKES 1/2 TABLET IN THE MORNING AND 1 TABLET AT NIGHT   amLODipine 5 MG tablet Commonly known as:  NORVASC Take 5 mg by mouth at bedtime.   aspirin EC 81 MG tablet Take 81 mg by mouth at bedtime.   atenolol 50 MG tablet Commonly known as:  TENORMIN Take 50 mg by mouth 2 (two) times daily.   atorvastatin 20 MG tablet Commonly known as:  LIPITOR Take 20 mg by mouth every evening.   BACTRIM 400-80 MG tablet Generic drug:  sulfamethoxazole-trimethoprim Take 1 tablet by mouth every other day. For UTI prevention.   CALCIUM PLUS VITAMIN D PO Take 2 tablets by mouth daily.   ciprofloxacin 500 MG tablet Commonly known as:  CIPRO Take 1 tablet (500 mg total) by mouth 2 (two) times daily. One po bid x 7 days   docusate sodium 100 MG capsule Commonly known as:  COLACE Take 1 capsule (100 mg total) by mouth 3 (three) times daily as needed.   Fish Oil 1000 MG Caps Take 1,000 mg by mouth daily.   gabapentin 300 MG capsule Commonly known as:  NEURONTIN Take 300-600 mg by mouth at bedtime.   hydrochlorothiazide 25 MG tablet Commonly known as:  HYDRODIURIL Take 25 mg by mouth daily.   levothyroxine 88 MCG tablet Commonly known as:  SYNTHROID, LEVOTHROID Take 88 mcg by mouth daily before breakfast.   metroNIDAZOLE 500 MG tablet Commonly known as:  FLAGYL Take 1 tablet (500 mg total) by mouth 2 (two) times daily. One po bid x 7 days   nortriptyline 25 MG capsule Commonly known as:  PAMELOR Take 50 mg by mouth at bedtime.   omeprazole 20 MG capsule Commonly  known as:  PRILOSEC Take 20 mg by mouth daily.   oxyCODONE-acetaminophen 5-325 MG  tablet Commonly known as:  PERCOCET/ROXICET Take 1-2 tablets by mouth every 4 (four) hours as needed for moderate pain. 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   Vitamin D3 5000 units Tabs Take 5,000 Units by mouth daily.       Diagnostic Studies: Dg Chest 2 View  Result Date: 06/07/2016 CLINICAL DATA:  Preoperative examination prior performance of reverse right shoulder arthroplasty. EXAM: CHEST  2 VIEW COMPARISON:  None in PACs FINDINGS: There is an acute comminuted fracture of the right humeral head and neck. The lungs are well-expanded. There is no focal infiltrate. There is no pleural effusion. The heart is normal in size. There is calcification in the wall of the aortic arch. The pulmonary vascularity is normal. The observed bony thorax exhibits no acute abnormality. IMPRESSION: Mild chronic bronchitic -smoking related pulmonary changes. No acute cardiopulmonary abnormality. Thoracic aortic atherosclerosis. Electronically Signed   By: David  [place] M.D.   On: 06/07/2016 07:52   Ct Abdomen Pelvis W Contrast  Result Date: 05/15/2016 CLINICAL DATA:  Abdominal pain. Nausea, vomiting and diarrhea with chills sweats. Nonproductive cough. EXAM: CT ABDOMEN AND PELVIS WITH CONTRAST TECHNIQUE: Multidetector CT imaging of the abdomen and pelvis was performed using the standard protocol following bolus administration of intravenous contrast. CONTRAST:  ISOVUE-300 IOPAMIDOL (ISOVUE-300) INJECTION 61% COMPARISON:  None. FINDINGS: Lower chest: The visualized cardiac chambers are normal in size. No pericardial effusion. Minimal pleural thickening along the periphery of the left hemithorax with  adjacent atelectasis. There is a small hiatal hernia. Hepatobiliary: 10 mm hypodensity in the right hepatic lobe, nonspecific in appearance potentially a cyst or hemangioma. This can be further correlated with a nonemergent ultrasound. No biliary dilatation. Gallbladder is free of stones. Pancreas: Atrophic appearing pancreas without ductal dilatation or mass. Spleen: Normal Adrenals/Urinary Tract: Adrenal glands are unremarkable. Kidneys are normal, without renal calculi, focal lesion, or hydronephrosis. Bladder is unremarkable. Stomach/Bowel: Contracted stomach. Normal small bowel rotation without small-bowel dilatation nor obstruction. Mild t moderate diffuse thickening of the descending and sigmoid colon with scattered diverticulosis. Findings could represent sequela of a mild colitis. Appendix is not optimally visualized. Vascular/Lymphatic: Aortic atherosclerosis without aneurysm. No lymphadenopathy by size criteria. Mild hazy appearance of the mesenteric may represent changes of sclerosing mesenteritis. Reproductive: Hysterectomy.  No adnexal mass. Other: No abdominal wall hernia or abnormality. No abdominopelvic ascites. Musculoskeletal: Degenerate disc disease L2-3 and L5-S1. Sclerotic density of the L5 vertebral body is nonspecific likely an isolated large bone island in the absence of medical history that may predispose to osteoblastic disease. IMPRESSION: 1. Mild-to-moderate diffuse transmural thickening of descending and sigmoid colon suspicious for colitis. Superimposed diverticulosis of the sigmoid colon. No bowel obstruction or perforation. 2. 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 3. 10 mm right hepatic hypodensity, nonspecific potentially complex cyst or hemangioma. This can be further correlated with ultrasound on a nonemergent basis. 4. Degenerative disc disease L2-3 and L5-S1 with sclerotic L5 vertebral body densities statistically more likely to represent a  bone island. 5. Aortic atherosclerosis. Electronically Signed   By: Tollie Eth M.D.   On: 05/15/2016 17:54   Dg Shoulder Right Port  Result Date: 06/07/2016 CLINICAL DATA:  Status post reverse right shoulder arthroplasty. EXAM: PORTABLE RIGHT  SHOULDER COMPARISON:  Chest radiography same day FINDINGS: Reverse shoulder arthroplasty. Components appear well positioned. Fracture line at the surgical neck remains visible, but components appear well positioned and fragments are not displaced. Old healed rib fractures on the right. IMPRESSION: Reverse right shoulder arthroplasty. Electronically Signed   By: Paulina Fusi M.D.   On: 06/07/2016 14:51    Disposition: 01-Home or Self Care  Discharge Instructions    Call MD / Call 911    Complete by:  As directed    If you experience chest pain or shortness of breath, CALL 911 and be transported to the hospital emergency room.  If you develope a fever above 101 F, pus (white drainage) or increased drainage or redness at the wound, or calf pain, call your surgeon's office.   Call MD / Call 911    Complete by:  As directed    If you experience chest pain or shortness of breath, CALL 911 and be transported to the hospital emergency room.  If you develope a fever above 101 F, pus (white drainage) or increased drainage or redness at the wound, or calf pain, call your surgeon's office.   Constipation Prevention    Complete by:  As directed    Drink plenty of fluids.  Prune juice may be helpful.  You may use a stool softener, such as Colace (over the counter) 100 mg twice a day.  Use MiraLax (over the counter) for constipation as needed.   Constipation Prevention    Complete by:  As directed    Drink plenty of fluids.  Prune juice may be helpful.  You may use a stool softener, such as Colace (over the counter) 100 mg twice a day.  Use MiraLax (over the counter) for constipation as needed.   Diet - low sodium heart healthy    Complete by:  As directed    Diet -  low sodium heart healthy    Complete by:  As directed    Driving restrictions    Complete by:  As directed    No driving for 6 weeks   Increase activity slowly as tolerated    Complete by:  As directed    Increase activity slowly as tolerated    Complete by:  As directed       Follow-up Information    Mable Paris, MD. Schedule an appointment as soon as possible for a visit in 2 weeks.   Specialty:  Orthopedic Surgery Contact information: [address] [place] [address] [phone]            Signed: Mable Paris 06/08/2016, 8:13 AM

## 2016-06-08 NOTE — Progress Notes (Signed)
Pt. Education complete with spouse at the beside. All questions answered. IV removed, pt. Tolerated well. Volunteers escorted the pt out in wheelchair to spouse vehicle. No s/s of acute distress noted.

## 2018-03-01 IMAGING — CT CT ABD-PELV W/ CM
2 of 5 series · 14 of 46 positions shown, 16 images · IV contrast (iopamidol)
Comparison: None.

CLINICAL DATA: Abdominal pain. Nausea, vomiting and diarrhea with
chills sweats. Nonproductive cough.

EXAM:
CT ABDOMEN AND PELVIS WITH CONTRAST
TECHNIQUE: Multidetector CT imaging of the abdomen and pelvis was performed
using the standard protocol following bolus administration of
intravenous contrast.
CONTRAST:  100mL [id number]-ZRR IOPAMIDOL ([id number]-ZRR) INJECTION 61%

[Series 3: a/p w/ 5mm · axial · 0.85mm/px · z∈[+745,+1155]mm · 11 of 92 slices shown, 13 images]
[im 5/92  soft-tissue]
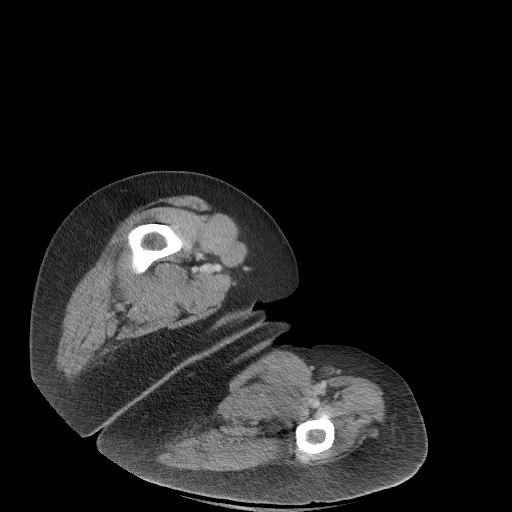
[im 5/92  bone]
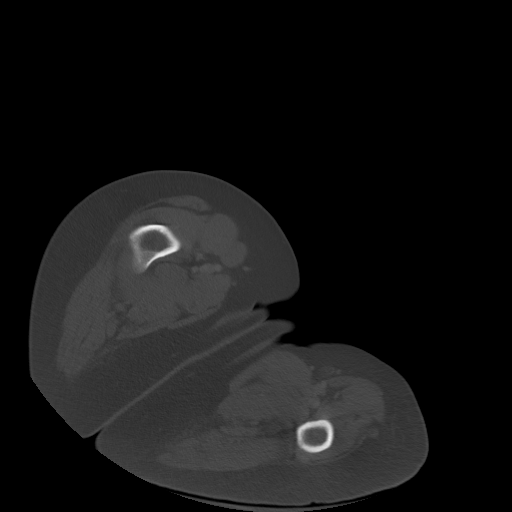
[im 14/92  soft-tissue]
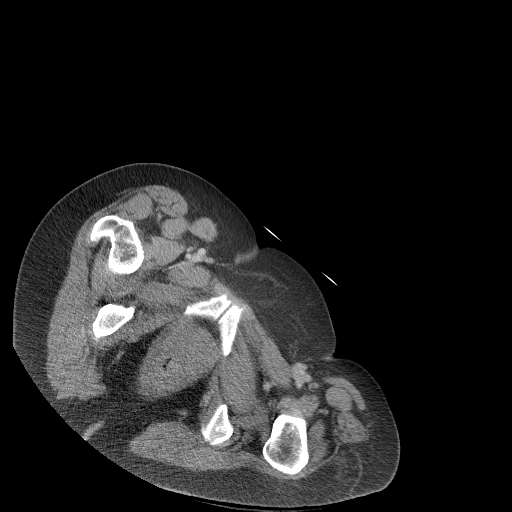
[im 23/92  soft-tissue]
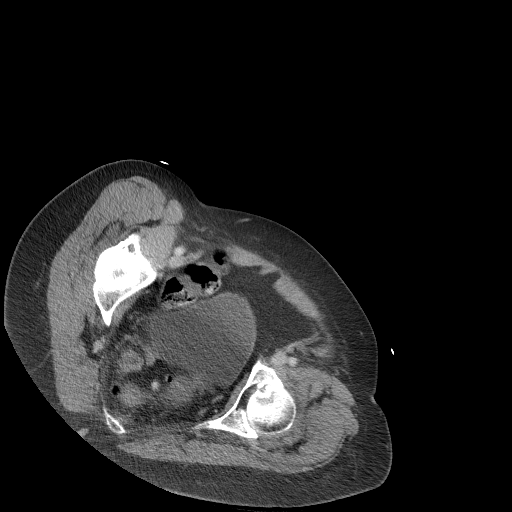
[im 32/92  soft-tissue]
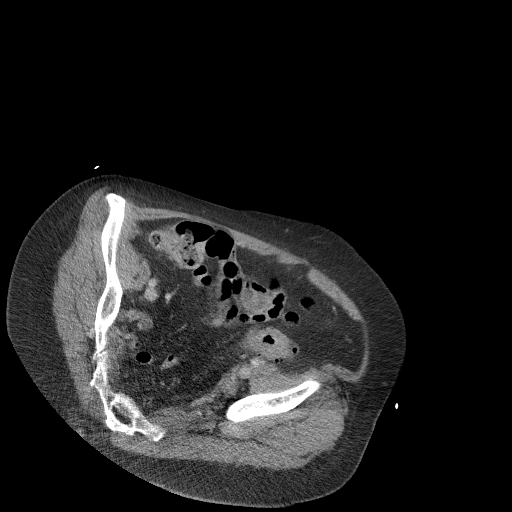
[im 37/92  soft-tissue]
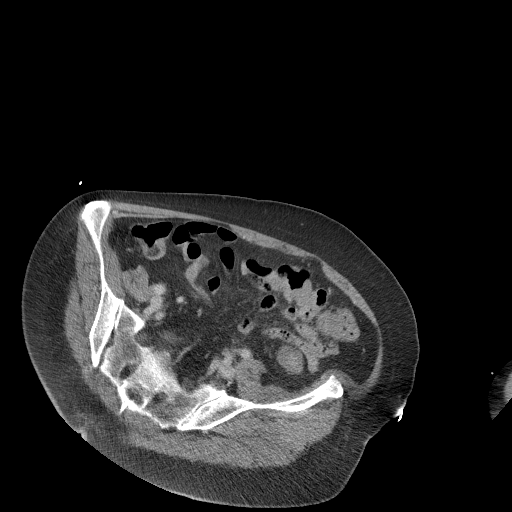
[im 46/92  soft-tissue]
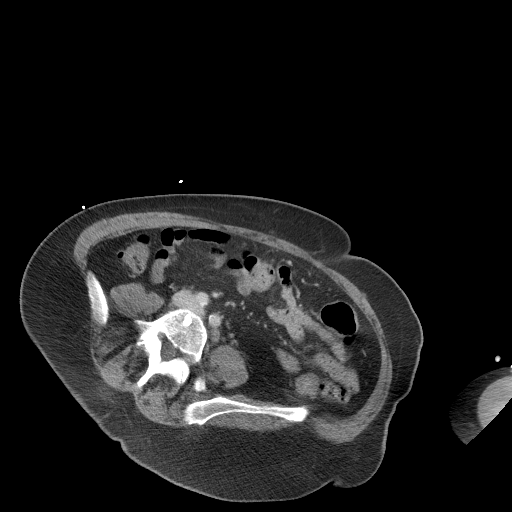
[im 55/92  soft-tissue]
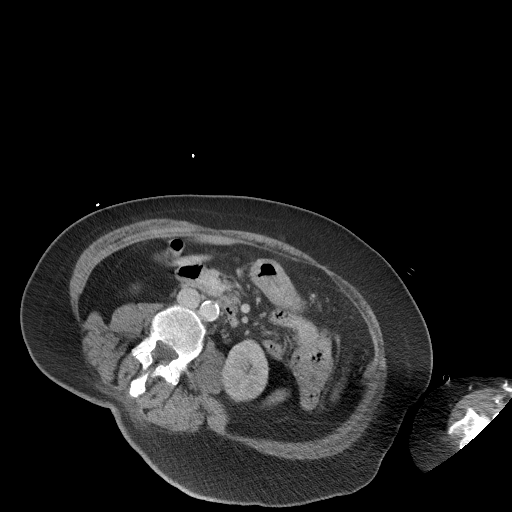
[im 60/92  soft-tissue]
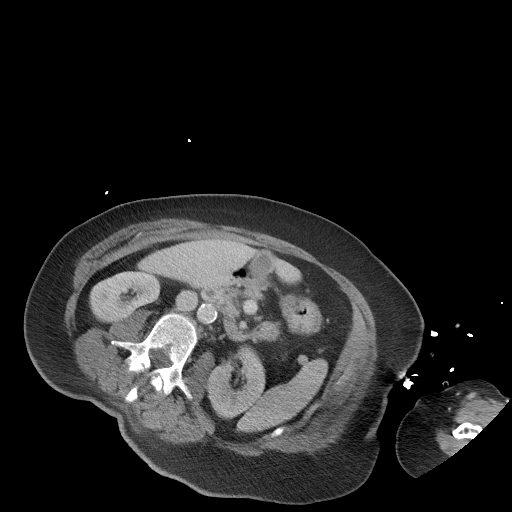
[im 69/92  soft-tissue]
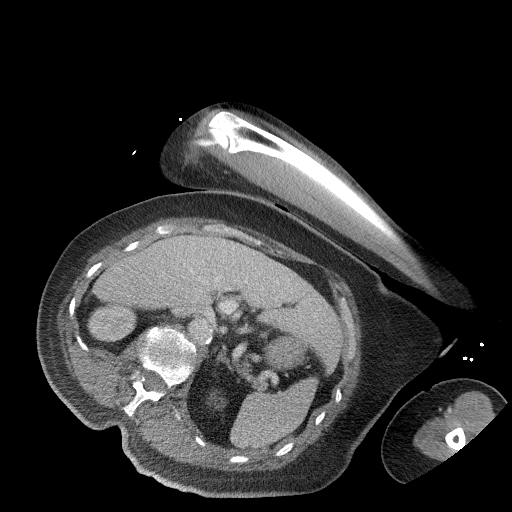
[im 69/92  bone]
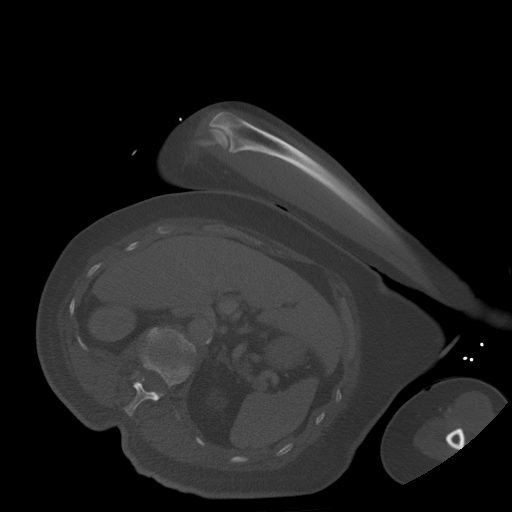
[im 78/92  soft-tissue]
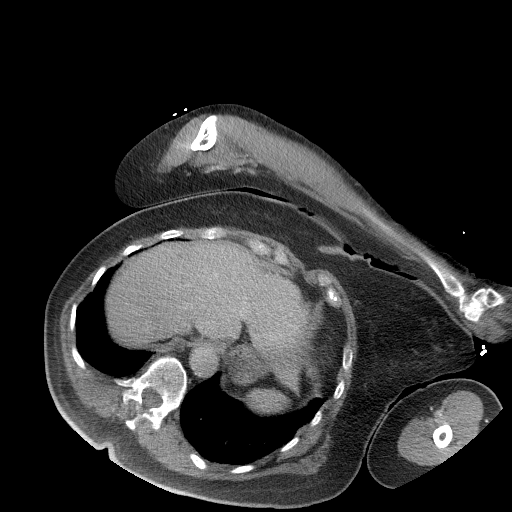
[im 87/92  soft-tissue]
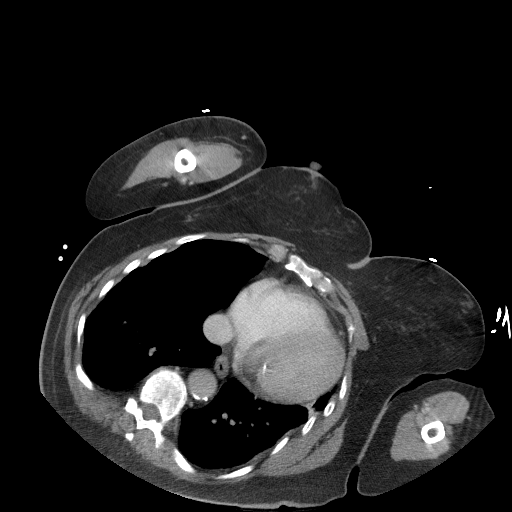

[Series 7: a/p w/ sag · coronal · 0.57mm/px · 3 of 191 slices shown]
[im 64/191  soft-tissue]
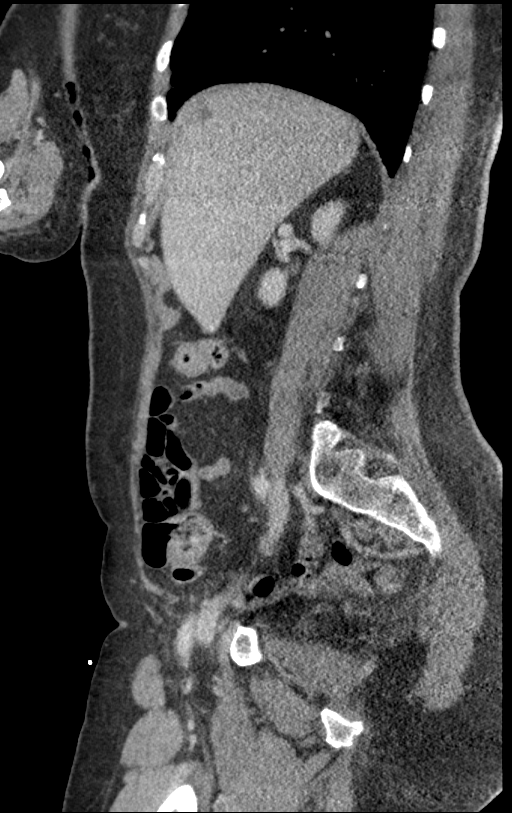
[im 85/191  soft-tissue]
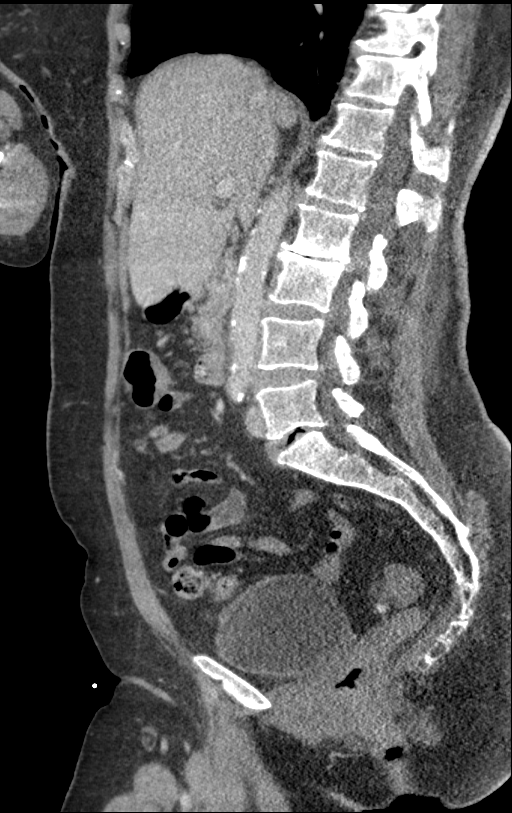
[im 106/191  soft-tissue]
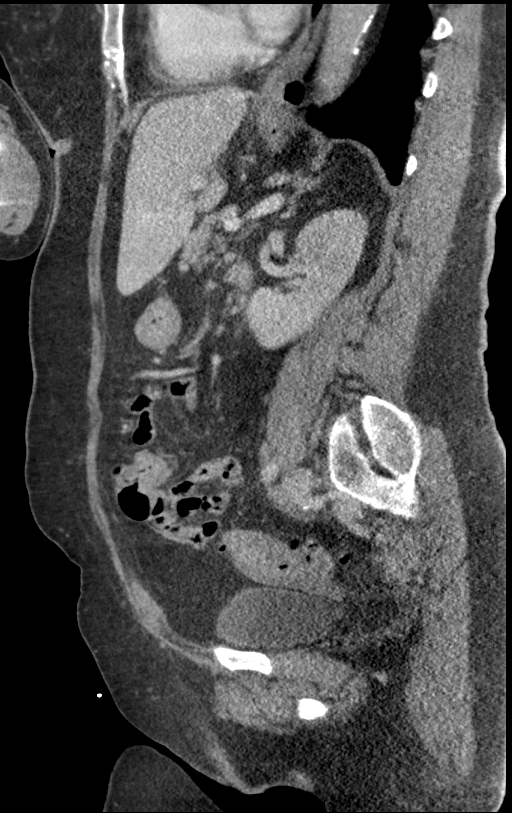

[14 of 46 positions shown; findings below may reference images not displayed]

FINDINGS: Lower chest: The visualized cardiac chambers are normal in size. No
pericardial effusion. Minimal pleural thickening along the periphery
of the left hemithorax with adjacent atelectasis. There is a small
hiatal hernia.

Hepatobiliary: 10 mm hypodensity in the right hepatic lobe,
nonspecific in appearance potentially a cyst or hemangioma. This can
be further correlated with a nonemergent ultrasound. No biliary
dilatation. Gallbladder is free of stones.

Pancreas: Atrophic appearing pancreas without ductal dilatation or
mass.

Spleen: Normal

Adrenals/Urinary Tract: Adrenal glands are unremarkable. Kidneys are
normal, without renal calculi, focal lesion, or hydronephrosis.
Bladder is unremarkable.

Stomach/Bowel: Contracted stomach. Normal small bowel rotation
without small-bowel dilatation nor obstruction. Mild t moderate
diffuse thickening of the descending and sigmoid colon with
scattered diverticulosis. Findings could represent sequela of a mild
colitis. Appendix is not optimally visualized.

Vascular/Lymphatic: Aortic atherosclerosis without aneurysm. No
lymphadenopathy by size criteria. Mild hazy appearance of the
mesenteric may represent changes of sclerosing mesenteritis.

Reproductive: Hysterectomy.  No adnexal mass.

Other: No abdominal wall hernia or abnormality. No abdominopelvic
ascites.

Musculoskeletal: Degenerate disc disease L2-3 and L5-S1. Sclerotic
density of the L5 vertebral body is nonspecific likely an isolated
large bone island in the absence of medical history that may
predispose to osteoblastic disease.
IMPRESSION: 1. Mild-to-moderate diffuse transmural thickening of descending and
sigmoid colon suspicious for colitis. Superimposed diverticulosis of
the sigmoid colon. No bowel obstruction or perforation.
2. Mild hazy mesenteric fat which may be seen with sclerosing
mesenteritis.
3. 10 mm right hepatic hypodensity, nonspecific potentially complex
cyst or hemangioma. This can be further correlated with ultrasound
on a nonemergent basis.
4. Degenerative disc disease L2-3 and L5-S1 with sclerotic L5
vertebral body densities statistically more likely to represent a
bone island.
5. Aortic atherosclerosis.

## 2018-03-24 IMAGING — CR DG CHEST 2V
2 series · 2 of 2 positions shown · non-contrast
Comparison: None in PACs

CLINICAL DATA: Preoperative examination prior performance of
reverse right shoulder arthroplasty.

EXAM:
CHEST  2 VIEW

[w chest pa]
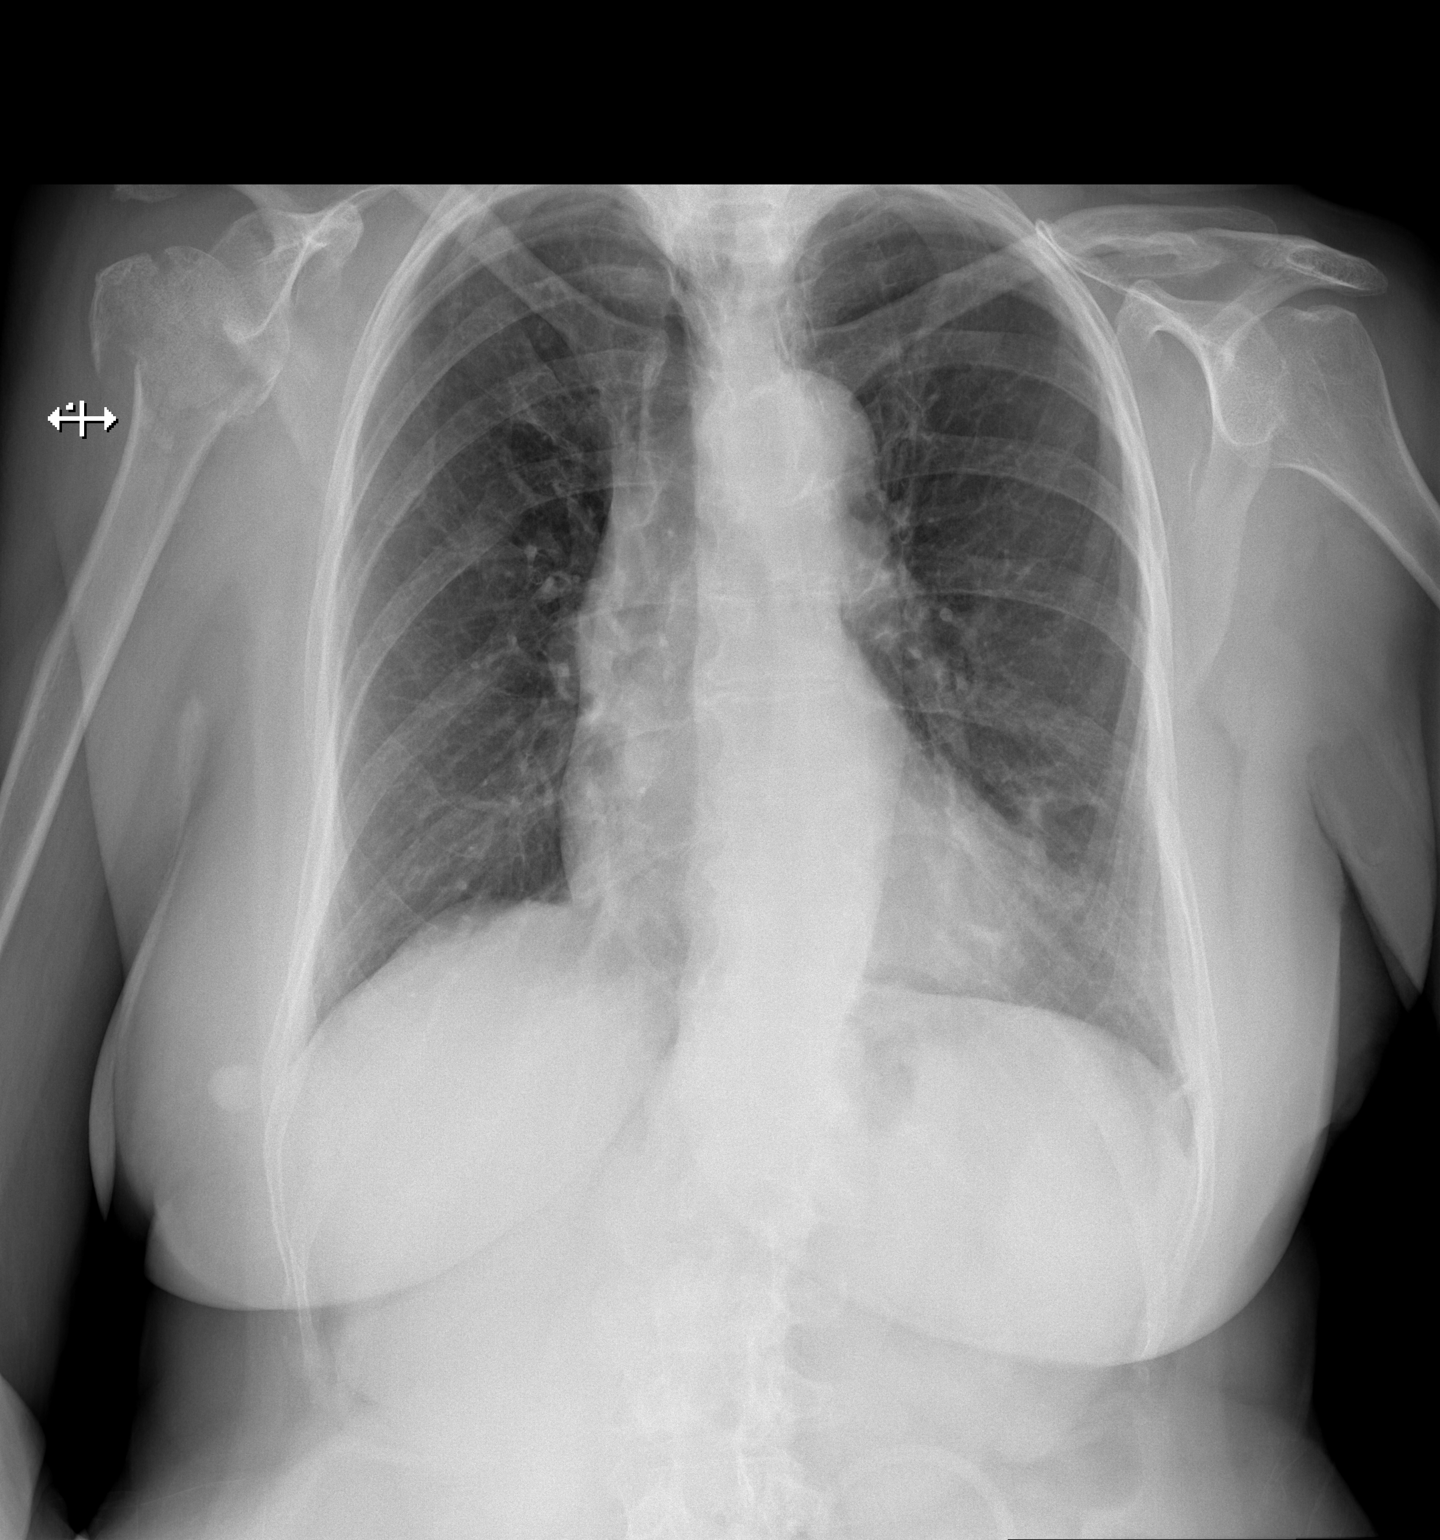

[w chest lat]
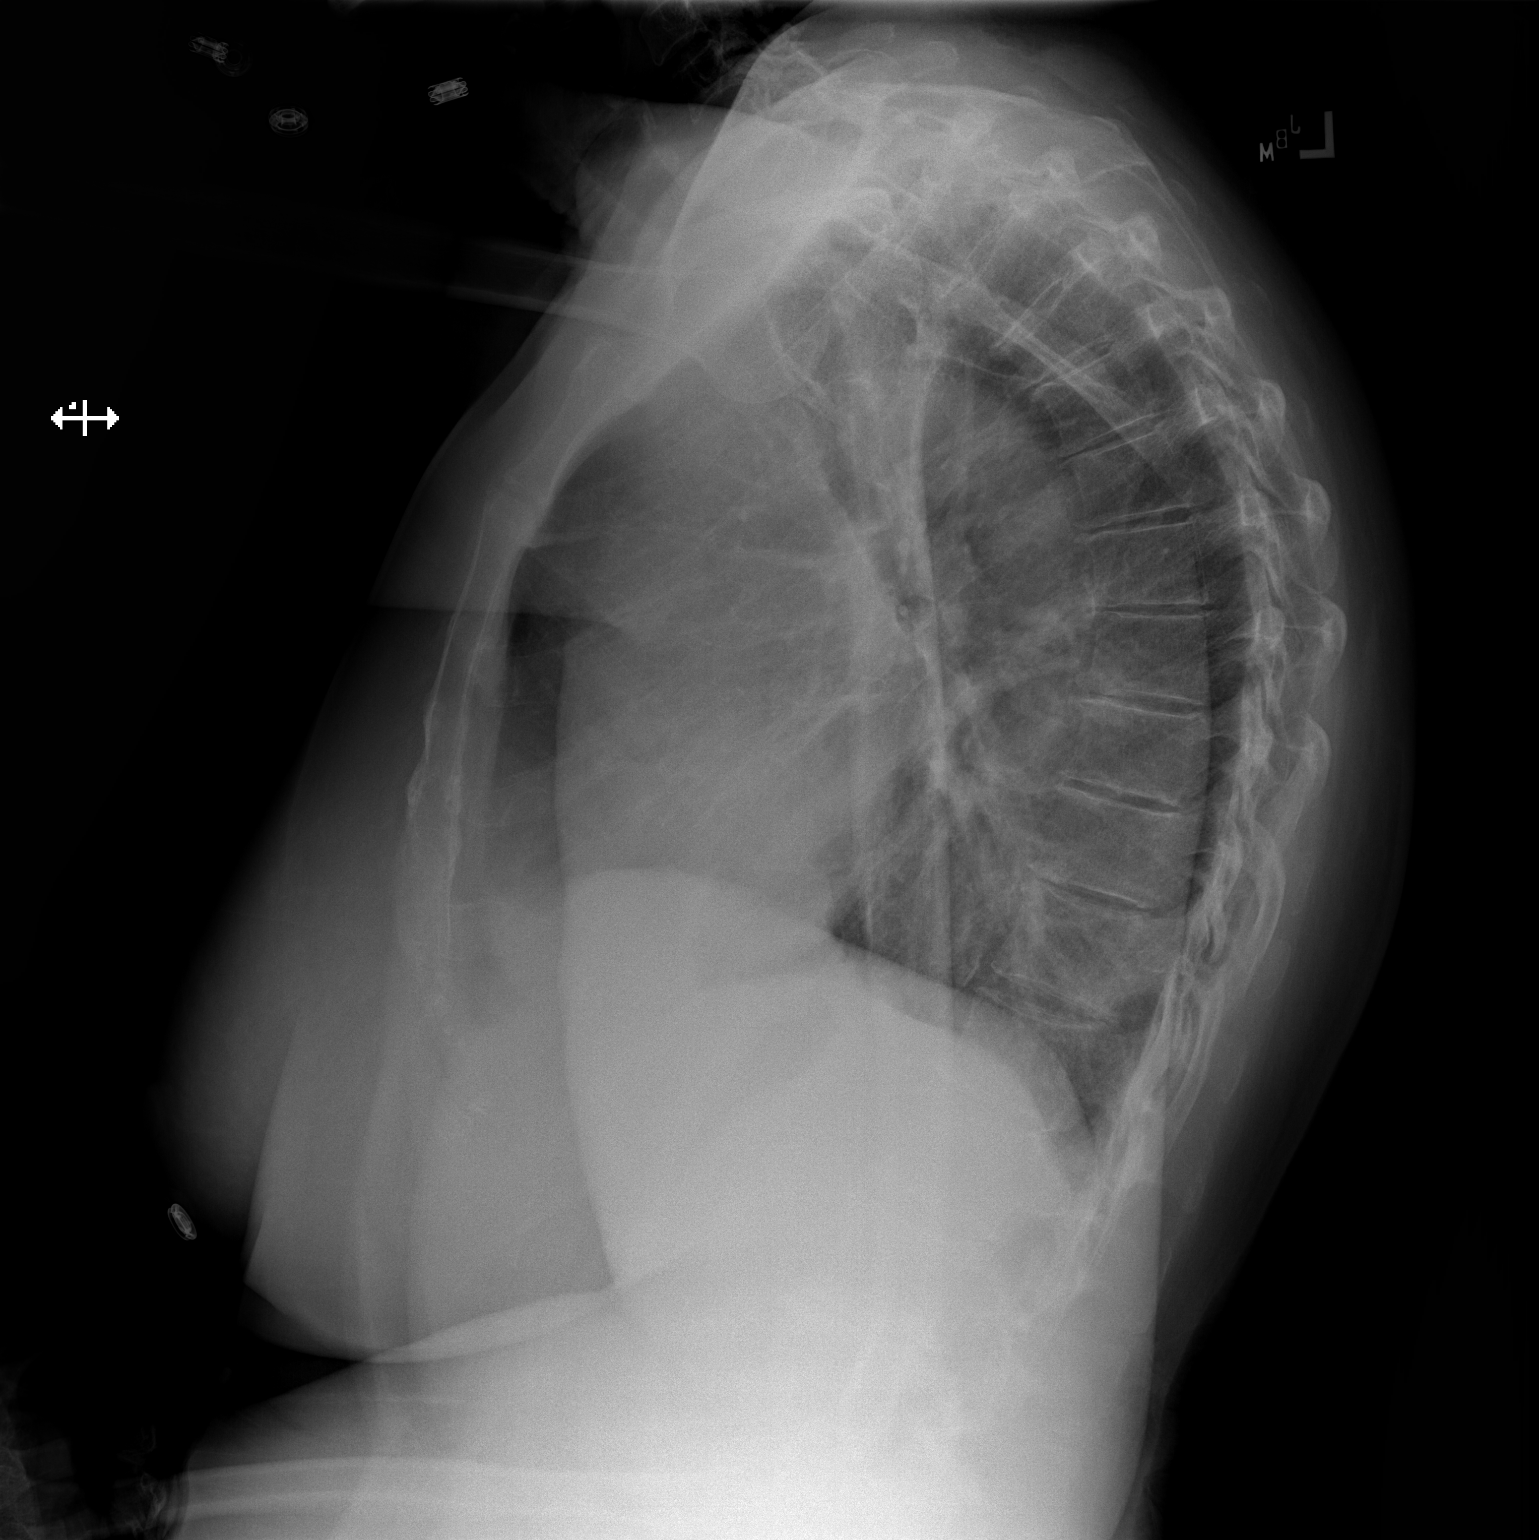

[2 of 2 positions shown; findings below may reference images not displayed]

FINDINGS: There is an acute comminuted fracture of the right humeral head and
neck. The lungs are well-expanded. There is no focal infiltrate.
There is no pleural effusion. The heart is normal in size. There is
calcification in the wall of the aortic arch. The pulmonary
vascularity is normal. The observed bony thorax exhibits no acute
abnormality.
IMPRESSION: Mild chronic bronchitic -smoking related pulmonary changes. No acute
cardiopulmonary abnormality.

Thoracic aortic atherosclerosis.

## 2022-09-19 ENCOUNTER — Other Ambulatory Visit (HOSPITAL_BASED_OUTPATIENT_CLINIC_OR_DEPARTMENT_OTHER): Payer: Self-pay | Admitting: Family Medicine
